# Patient Record
Sex: Female | Born: 1980 | Race: White | Hispanic: No | Marital: Single | Smoking: Former smoker
Health system: Southern US, Community
[De-identification: ages and names within clinical notes are randomized; demographics above are authoritative.]

## PROBLEM LIST (undated history)

## (undated) DIAGNOSIS — G43009 Migraine without aura, not intractable, without status migrainosus: Secondary | ICD-10-CM

## (undated) DIAGNOSIS — F1111 Opioid abuse, in remission: Secondary | ICD-10-CM

## (undated) HISTORY — DX: Migraine without aura, not intractable, without status migrainosus: G43.009

## (undated) HISTORY — DX: Opioid abuse, in remission: F11.11

## (undated) HISTORY — PX: WRIST SURGERY: SHX841

---

## 2007-11-07 ENCOUNTER — Emergency Department: Payer: Self-pay | Admitting: Unknown Physician Specialty

## 2008-09-23 ENCOUNTER — Emergency Department: Payer: Self-pay

## 2014-01-30 ENCOUNTER — Ambulatory Visit: Payer: Self-pay | Admitting: Emergency Medicine

## 2014-01-30 LAB — RAPID INFLUENZA A&B ANTIGENS (ARMC ONLY)

## 2014-06-24 DIAGNOSIS — J309 Allergic rhinitis, unspecified: Secondary | ICD-10-CM | POA: Insufficient documentation

## 2014-11-29 DIAGNOSIS — F1121 Opioid dependence, in remission: Secondary | ICD-10-CM | POA: Insufficient documentation

## 2014-12-18 ENCOUNTER — Emergency Department: Payer: Self-pay | Admitting: Emergency Medicine

## 2015-09-26 DIAGNOSIS — K5900 Constipation, unspecified: Secondary | ICD-10-CM | POA: Insufficient documentation

## 2015-12-08 ENCOUNTER — Other Ambulatory Visit: Payer: Self-pay | Admitting: Specialist

## 2015-12-08 DIAGNOSIS — M5442 Lumbago with sciatica, left side: Secondary | ICD-10-CM

## 2015-12-27 ENCOUNTER — Ambulatory Visit
Admission: RE | Admit: 2015-12-27 | Discharge: 2015-12-27 | Disposition: A | Discharge status: Home / Self Care | Payer: Medicaid Other | Source: Ambulatory Visit | Attending: Specialist | Admitting: Specialist

## 2015-12-27 DIAGNOSIS — M5442 Lumbago with sciatica, left side: Secondary | ICD-10-CM | POA: Insufficient documentation

## 2015-12-27 DIAGNOSIS — M5116 Intervertebral disc disorders with radiculopathy, lumbar region: Secondary | ICD-10-CM | POA: Insufficient documentation

## 2016-08-07 DIAGNOSIS — G4489 Other headache syndrome: Secondary | ICD-10-CM | POA: Insufficient documentation

## 2016-08-30 DIAGNOSIS — M545 Low back pain, unspecified: Secondary | ICD-10-CM | POA: Insufficient documentation

## 2017-03-20 DIAGNOSIS — F5111 Primary hypersomnia: Secondary | ICD-10-CM | POA: Insufficient documentation

## 2017-03-20 DIAGNOSIS — I8393 Asymptomatic varicose veins of bilateral lower extremities: Secondary | ICD-10-CM | POA: Insufficient documentation

## 2017-04-15 ENCOUNTER — Ambulatory Visit: Payer: Medicaid Other

## 2017-05-21 ENCOUNTER — Ambulatory Visit: Payer: Medicaid Other | Attending: Internal Medicine

## 2017-05-22 ENCOUNTER — Ambulatory Visit: Payer: Medicaid Other | Attending: Internal Medicine

## 2018-06-02 DIAGNOSIS — E785 Hyperlipidemia, unspecified: Secondary | ICD-10-CM | POA: Insufficient documentation

## 2018-08-12 ENCOUNTER — Encounter: Payer: Self-pay | Admitting: *Deleted

## 2018-08-12 ENCOUNTER — Telehealth: Payer: Self-pay | Admitting: Neurology

## 2018-08-12 ENCOUNTER — Encounter: Payer: Self-pay | Admitting: Neurology

## 2018-08-12 ENCOUNTER — Ambulatory Visit: Payer: Medicaid Other | Admitting: Neurology

## 2018-08-12 VITALS — BP 136/90 | HR 72 | Ht 71.0 in | Wt 200.0 lb

## 2018-08-12 DIAGNOSIS — R51 Headache with orthostatic component, not elsewhere classified: Secondary | ICD-10-CM

## 2018-08-12 DIAGNOSIS — G43009 Migraine without aura, not intractable, without status migrainosus: Secondary | ICD-10-CM | POA: Diagnosis not present

## 2018-08-12 DIAGNOSIS — G8929 Other chronic pain: Secondary | ICD-10-CM

## 2018-08-12 DIAGNOSIS — G4440 Drug-induced headache, not elsewhere classified, not intractable: Secondary | ICD-10-CM

## 2018-08-12 DIAGNOSIS — H539 Unspecified visual disturbance: Secondary | ICD-10-CM | POA: Diagnosis not present

## 2018-08-12 DIAGNOSIS — R519 Headache, unspecified: Secondary | ICD-10-CM

## 2018-08-12 MED ORDER — METHYLPREDNISOLONE 4 MG PO TBPK: ORAL_TABLET | ORAL | 2 refills | Status: DC

## 2018-08-12 MED ORDER — AMITRIPTYLINE HCL 25 MG PO TABS: 25.0000 mg | ORAL_TABLET | Freq: Every day | ORAL | 3 refills | Status: DC

## 2018-08-12 NOTE — Patient Instructions (Addendum)
MRI brain w/wo contrast Decrease or stop the Excedrin overuse Start Amitriptyline at bedtime to help with migraines and sleep Medrol dosepak (Steroids)    Amitriptyline tablets What is this medicine? AMITRIPTYLINE (a mee TRIP ti leen) is used to treat depression. This medicine may be used for other purposes; ask your health care provider or pharmacist if you have questions. COMMON BRAND NAME(S): Elavil, Vanatrip What should I tell my health care provider before I take this medicine? They need to know if you have any of these conditions: -an alcohol problem -asthma, difficulty breathing -bipolar disorder or schizophrenia -difficulty passing urine, prostate trouble -glaucoma -heart disease or previous heart attack -liver disease -over active thyroid -seizures -thoughts or plans of suicide, a previous suicide attempt, or family history of suicide attempt -an unusual or allergic reaction to amitriptyline, other medicines, foods, dyes, or preservatives -pregnant or trying to get pregnant -breast-feeding How should I use this medicine? Take this medicine by mouth with a drink of water. Follow the directions on the prescription label. You can take the tablets with or without food. Take your medicine at regular intervals. Do not take it more often than directed. Do not stop taking this medicine suddenly except upon the advice of your doctor. Stopping this medicine too quickly may cause serious side effects or your condition may worsen. A special MedGuide will be given to you by the pharmacist with each prescription and refill. Be sure to read this information carefully each time. Talk to your pediatrician regarding the use of this medicine in children. Special care may be needed. Overdosage: If you think you have taken too much of this medicine contact a poison control center or emergency room at once. NOTE: This medicine is only for you. Do not share this medicine with others. What if I  miss a dose? If you miss a dose, take it as soon as you can. If it is almost time for your next dose, take only that dose. Do not take double or extra doses. What may interact with this medicine? Do not take this medicine with any of the following medications: -arsenic trioxide -certain medicines used to regulate abnormal heartbeat or to treat other heart conditions -cisapride -droperidol -halofantrine -linezolid -MAOIs like Carbex, Eldepryl, Marplan, Nardil, and Parnate -methylene blue -other medicines for mental depression -phenothiazines like perphenazine, thioridazine and chlorpromazine -pimozide -probucol -procarbazine -sparfloxacin -St. John's Wort -ziprasidone This medicine may also interact with the following medications: -atropine and related drugs like hyoscyamine, scopolamine, tolterodine and others -barbiturate medicines for inducing sleep or treating seizures, like phenobarbital -cimetidine -disulfiram -ethchlorvynol -thyroid hormones such as levothyroxine This list may not describe all possible interactions. Give your health care provider a list of all the medicines, herbs, non-prescription drugs, or dietary supplements you use. Also tell them if you smoke, drink alcohol, or use illegal drugs. Some items may interact with your medicine. What should I watch for while using this medicine? Tell your doctor if your symptoms do not get better or if they get worse. Visit your doctor or health care professional for regular checks on your progress. Because it may take several weeks to see the full effects of this medicine, it is important to continue your treatment as prescribed by your doctor. Patients and their families should watch out for new or worsening thoughts of suicide or depression. Also watch out for sudden changes in feelings such as feeling anxious, agitated, panicky, irritable, hostile, aggressive, impulsive, severely restless, overly excited and hyperactive, or not  being able to sleep. If this happens, especially at the beginning of treatment or after a change in dose, call your health care professional. [place] may get drowsy or dizzy. Do not drive, use machinery, or do anything that needs mental alertness until you know how this medicine affects you. Do not stand or sit up quickly, especially if you are an older patient. This reduces the risk of dizzy or fainting spells. Alcohol may interfere with the effect of this medicine. Avoid alcoholic drinks. Do not treat yourself for coughs, colds, or allergies without asking your doctor or health care professional for advice. Some ingredients can increase possible side effects. Your mouth may get dry. Chewing sugarless gum or sucking hard candy, and drinking plenty of water will help. Contact your doctor if the problem does not go away or is severe. This medicine may cause dry eyes and blurred vision. If you wear contact lenses you may feel some discomfort. Lubricating drops may help. See your eye doctor if the problem does not go away or is severe. This medicine can cause constipation. Try to have a bowel movement at least every 2 to 3 days. If you do not have a bowel movement for 3 days, call your doctor or health care professional. This medicine can make you more sensitive to the sun. Keep out of the sun. If you cannot avoid being in the sun, wear protective clothing and use sunscreen. Do not use sun lamps or tanning beds/booths. What side effects may I notice from receiving this medicine? Side effects that you should report to your doctor or health care professional as soon as possible: -allergic reactions like skin rash, itching or hives, swelling of the face, lips, or tongue -anxious -breathing problems -changes in vision -confusion -elevated mood, decreased need for sleep, racing thoughts, impulsive behavior -eye pain -fast, irregular heartbeat -feeling faint or lightheaded, falls -feeling agitated, angry, or  irritable -fever with increased sweating -hallucination, loss of contact with reality -seizures -stiff muscles -suicidal thoughts or other mood changes -tingling, pain, or numbness in the feet or hands -trouble passing urine or change in the amount of urine -trouble sleeping -unusually weak or tired -vomiting -yellowing of the eyes or skin Side effects that usually do not require medical attention (report to your doctor or health care professional if they continue or are bothersome): -change in sex drive or performance -change in appetite or weight -constipation -dizziness -dry mouth -nausea -tired -tremors -upset stomach This list may not describe all possible side effects. Call your doctor for medical advice about side effects. You may report side effects to FDA at 1-800-FDA-1088. Where should I keep my medicine? Keep out of the reach of children. Store at room temperature between 20 and 25 degrees C (68 and 77 degrees F). Throw away any unused medicine after the expiration date. NOTE: This sheet is a summary. It may not cover all possible information. If you have questions about this medicine, talk to your doctor, pharmacist, or health care provider.  2018 Elsevier/Gold Standard (2016-03-22 12:14:15)  Methylprednisolone tablets What is this medicine? METHYLPREDNISOLONE (meth ill pred NISS oh lone) is a corticosteroid. It is commonly used to treat inflammation of the skin, joints, lungs, and other organs. Common conditions treated include asthma, allergies, and arthritis. It is also used for other conditions, such as blood disorders and diseases of the adrenal glands. This medicine may be used for other purposes; ask your health care provider or pharmacist if you have questions. COMMON BRAND NAME(S):  Medrol, Medrol Dosepak What should I tell my health care provider before I take this medicine? They need to know if you have any of these conditions: -Cushing's syndrome -eye  disease, vision problems -diabetes -glaucoma -heart disease -high blood pressure -infection (especially a virus infection such as chickenpox, cold sores, or herpes) -liver disease -mental illness -myasthenia gravis -osteoporosis -recently received or scheduled to receive a vaccine -seizures -stomach or intestine problems -thyroid disease -an unusual or allergic reaction to lactose, methylprednisolone, other medicines, foods, dyes, or preservatives -pregnant or trying to get pregnant -breast-feeding How should I use this medicine? Take this medicine by mouth with a glass of water. Follow the directions on the prescription label. Take this medicine with food. If you are taking this medicine once a day, take it in the morning. Do not take it more often than directed. Do not suddenly stop taking your medicine because you may develop a severe reaction. Your doctor will tell you how much medicine to take. If your doctor wants you to stop the medicine, the dose may be slowly lowered over time to avoid any side effects. Talk to your pediatrician regarding the use of this medicine in children. Special care may be needed. Overdosage: If you think you have taken too much of this medicine contact a poison control center or emergency room at once. NOTE: This medicine is only for you. Do not share this medicine with others. What if I miss a dose? If you miss a dose, take it as soon as you can. If it is almost time for your next dose, talk to your doctor or health care professional. You may need to miss a dose or take an extra dose. Do not take double or extra doses without advice. What may interact with this medicine? Do not take this medicine with any of the following medications: -alefacept -echinacea -live virus vaccines -metyrapone -mifepristone This medicine may also interact with the following medications: -amphotericin B -aspirin and aspirin-like medicines -certain antibiotics like  erythromycin, clarithromycin, troleandomycin -certain medicines for diabetes -certain medicines for fungal infections like ketoconazole -certain medicines for seizures like carbamazepine, phenobarbital, phenytoin -certain medicines that treat or prevent blood clots like warfarin -cholestyramine -cyclosporine -digoxin -diuretics -female hormones, like estrogens and birth control pills -isoniazid -NSAIDs, medicines for pain inflammation, like ibuprofen or naproxen -other medicines for myasthenia gravis -rifampin -vaccines This list may not describe all possible interactions. Give your health care provider a list of all the medicines, herbs, non-prescription drugs, or dietary supplements you use. Also tell them if you smoke, drink alcohol, or use illegal drugs. Some items may interact with your medicine. What should I watch for while using this medicine? Tell your doctor or healthcare professional if your symptoms do not start to get better or if they get worse. Do not stop taking except on your doctor's advice. You may develop a severe reaction. Your doctor will tell you how much medicine to take. This medicine may increase your risk of getting an infection. Tell your doctor or health care professional if you are around anyone with measles or chickenpox, or if you develop sores or blisters that do not heal properly. This medicine may affect blood sugar levels. If you have diabetes, check with your doctor or health care professional before you change your diet or the dose of your diabetic medicine. Tell your doctor or health care professional right away if you have any change in your eyesight. Using this medicine for a long time may increase  your risk of low bone mass. Talk to your doctor about bone health. What side effects may I notice from receiving this medicine? Side effects that you should report to your doctor or health care professional as soon as possible: -allergic reactions like skin  rash, itching or hives, swelling of the face, lips, or tongue -bloody or tarry stools -changes in vision -hallucination, loss of contact with reality -muscle cramps -muscle pain -palpitations -signs and symptoms of high blood sugar such as dizziness; dry mouth; dry skin; fruity breath; nausea; stomach pain; increased hunger or thirst; increased urination -signs and symptoms of infection like fever or chills; cough; sore throat; pain or trouble passing urine -trouble passing urine or change in the amount of urine Side effects that usually do not require medical attention (report to your doctor or health care professional if they continue or are bothersome): -changes in emotions or mood -constipation -diarrhea -excessive hair growth on the face or body -headache -nausea, vomiting -trouble sleeping -weight gain This list may not describe all possible side effects. Call your doctor for medical advice about side effects. You may report side effects to FDA at 1-800-FDA-1088. Where should I keep my medicine? Keep out of the reach of children. Store at room temperature between 20 and 25 degrees C (68 and 77 degrees F). Throw away any unused medicine after the expiration date. NOTE: This sheet is a summary. It may not cover all possible information. If you have questions about this medicine, talk to your doctor, pharmacist, or health care provider.  2018 Elsevier/Gold Standard (2015-12-28 15:53:30)

## 2018-08-12 NOTE — Telephone Encounter (Signed)
Patient is scheduled at [facility] in [facility] for 09/01/18 arrival time is 9:45 AM. I left voicemail for patient to be aware of this. I did leave their number of [phone] incase she needed to r/s.   Medicaid Josem Kaufmann: [id number] (exp. 08/12/18 to 09/11/18)

## 2018-08-12 NOTE — Progress Notes (Signed)
[facility]    Provider:  Dr Jaynee Eagles Referring Provider: Danelle Berry, NP Primary Care Physician:  Danelle Berry, NP  CC:  Migraines  HPI:  Faith Hill is a 37 y.o. female here as requested by Dr. Charlynn Grimes for migraines. PMHx long-term methadone use, HLD, elevated LFTs, current smoker, prior opioid abuse, 3-6 sodas a day, daily smoker, no alcohol, anxiety, depression. On Reglan and zofran. Headaches since a teenager. Started worsening in the last 18 months with migraines. She has had 2 sleep studies and were negative. Her migraines start on the right behind the eye and temple, She takes excedrin migraine which helps and sleep helps. They can be "debilitating" and moves sides, her neck hurts, pounding and throbbing with heart beat, +photo/phonophobia, nausea and vomiting. Lasts 24-72 hours. They put her in bed. Daily headaches that she wakes up with, they have woken her up in the middle of the night, worse when she bends over, she has 4 migraine days a month. She takes excedrin every other day. She has never been on a preventative for migraines. She has vision changes with the headaches especially when severe/ No other focal neurologic deficits, associated symptoms, inciting events or modifiable factors. No aura.   Reviewed notes, labs and imaging from outside physicians, which showed:  Notes reviewed from Dr. Charlynn Grimes state TSH, cbc,cmp recently taken will get the results. She has tried imitrex and magnesium. Imitrex did not work. Magnesium over 5-6 weeks did not make any difference. Sleep studies without OSA. She takes ondansetron with a headache. She was prescribed metoclopramide but did not get that.   Review of Systems: Patient complains of symptoms per HPI as well as the following symptoms: insomnia, memory loss, headache, weakness, racing thought. Pertinent negatives and positives per HPI. All others negative.   Social History   Socioeconomic History    . Marital status: Single    Spouse name: Not on file  . Number of children: 1  . Years of education: Not on file  . Highest education level: Some college, no degree  Occupational History  . Not on file  Social Needs  . Financial resource strain: Not on file  . Food insecurity:    Worry: Not on file    Inability: Not on file  . Transportation needs:    Medical: Not on file    Non-medical: Not on file  Tobacco Use  . Smoking status: Former Research scientist (life sciences)  . Smokeless tobacco: Never Used  . Tobacco comment: 1.25-1.5 ppd  Substance and Sexual Activity  . Alcohol use: Not Currently    Frequency: Never  . Drug use: Not Currently    Comment: quit 2009; h/o opioid abuse  . Sexual activity: Not Currently    Birth control/protection: None  Lifestyle  . Physical activity:    Days per week: Not on file    Minutes per session: Not on file  . Stress: Not on file  Relationships  . Social connections:    Talks on phone: Not on file    Gets together: Not on file    Attends religious service: Not on file    Active member of club or organization: Not on file    Attends meetings of clubs or organizations: Not on file    Relationship status: Not on file  . Intimate partner violence:    Fear of current or ex partner: Not on file    Emotionally abused: Not on file    Physically abused: Not  on file    Forced sexual activity: Not on file  Other Topics Concern  . Not on file  Social History Narrative   Caffeine: about 40 oz daily   Lives at home with her daughter and mother lives with them   Right handed    Family History  Problem Relation Age of Onset  . Heart disease Other   . Heart disease Mother     Past Medical History:  Diagnosis Date  . H/O opioid abuse ([place])    sober since 2009  . Migraines     Past Surgical History:  Procedure Laterality Date  . WRIST SURGERY Bilateral    pins     Current Outpatient Medications  Medication Sig Dispense Refill  .  aspirin-acetaminophen-caffeine (EXCEDRIN MIGRAINE) 250-250-65 MG tablet Take by mouth as needed for headache.    . furosemide (LASIX) 20 MG tablet Take 20 mg by mouth daily.    Marland Kitchen METHADONE HCL PO Take 70 mg by mouth daily. Liquid    . spironolactone-hydrochlorothiazide (ALDACTAZIDE) 25-25 MG tablet Take 1 tablet by mouth daily.  1  . amitriptyline (ELAVIL) 25 MG tablet Take 1 tablet (25 mg total) by mouth at bedtime. 30 tablet 3  . methylPREDNISolone (MEDROL DOSEPAK) 4 MG TBPK tablet Take pills each morning wit food x 6 days 21 tablet 2   No current facility-administered medications for this visit.     Allergies as of 08/12/2018 - Review Complete 08/12/2018  Allergen Reaction Noted  . Sulfa antibiotics Hives and Rash 08/12/2018    Vitals: BP 136/90 (BP Location: Left Arm, Patient Position: Sitting)   Pulse 72   Ht 5\' 11"  (1.803 m)   Wt 200 lb (90.7 kg)   BMI 27.89 kg/m  Last Weight:  Wt Readings from Last 1 Encounters:  08/12/18 200 lb (90.7 kg)   Last Height:   Ht Readings from Last 1 Encounters:  08/12/18 5\' 11"  (1.803 m)   Physical exam: Exam: Gen: NAD, conversant, well nourised, well groomed                     CV: RRR, no MRG. No Carotid Bruits. + mild distal peripheral edema, warm, nontender Eyes: Conjunctivae clear without exudates or hemorrhage  Neuro: Detailed Neurologic Exam  Speech:    Speech is normal; fluent and spontaneous with normal comprehension.  Cognition:    The patient is oriented to person, place, and time;     recent and remote memory intact;     language fluent;     normal attention, concentration,     fund of knowledge Cranial Nerves:    The pupils are equal, round, and reactive to light. The fundi are normal and spontaneous venous pulsations are present. Visual fields are full to finger confrontation. Extraocular movements are intact. Trigeminal sensation is intact and the muscles of mastication are normal. The face is symmetric. The palate  elevates in the midline. Hearing intact. Voice is normal. Shoulder shrug is normal. The tongue has normal motion without fasciculations.   Coordination:    Normal finger to nose and heel to shin. Normal rapid alternating movements.   Gait:    Heel-toe and tandem gait are normal.   Motor Observation:    No asymmetry, no atrophy, and no involuntary movements noted. Tone:    Normal muscle tone.    Posture:    Posture is normal. normal erect    Strength:    Strength is V/V in the upper and  lower limbs.      Sensation: intact to LT     Reflex Exam:  DTR's:    Deep tendon reflexes in the upper and lower extremities are normal bilaterally.   Toes:    The toes are downgoing bilaterally.   Clonus:    Clonus is absent.       Assessment/Plan:  37 year old with migraines  - 2 sleep studies normal - MRI brain due to concerning symptoms of morning headaches, positional headaches,vision changes  to look for space occupying mass, chiari or intracranial hypertension (pseudotumor). - Takes excedrin every other day, discussed medication overuse - Will start with Amitriptyline since she has severe insomnia which it can help with as well.  - Stop Excedrin and other OTC meds and excessive caffeine (discussed in detail) - She sees an ENT for sinus problems - A medrol dosepak to bridge - following with cardiology for swelling, no heart arrythmias please discuss amitriptyline with cardiologist (sees them tomorrow)  Meds ordered this encounter  Medications  . amitriptyline (ELAVIL) 25 MG tablet    Sig: Take 1 tablet (25 mg total) by mouth at bedtime.    Dispense:  30 tablet    Refill:  3  . methylPREDNISolone (MEDROL DOSEPAK) 4 MG TBPK tablet    Sig: Take pills each morning wit food x 6 days    Dispense:  21 tablet    Refill:  2   Orders Placed This Encounter  Procedures  . MR BRAIN W WO CONTRAST     Discussed: To prevent or relieve headaches, try the following: Cool Compress.  Lie down and place a cool compress on your head.  Avoid headache triggers. If certain foods or odors seem to have triggered your migraines in the past, avoid them. A headache diary might help you identify triggers.  Include physical activity in your daily routine. Try a daily walk or other moderate aerobic exercise.  Manage stress. Find healthy ways to cope with the stressors, such as delegating tasks on your to-do list.  Practice relaxation techniques. Try deep breathing, yoga, massage and visualization.  Eat regularly. Eating regularly scheduled meals and maintaining a healthy diet might help prevent headaches. Also, drink plenty of fluids.  Follow a regular sleep schedule. Sleep deprivation might contribute to headaches Consider biofeedback. With this mind-body technique, you learn to control certain bodily functions - such as muscle tension, heart rate and blood pressure - to prevent headaches or reduce headache pain.    Proceed to emergency room if you experience new or worsening symptoms or symptoms do not resolve, if you have new neurologic symptoms or if headache is severe, or for any concerning symptom.   Provided education and documentation from American headache Society toolbox including articles on: chronic migraine medication overuse headache, chronic migraines, prevention of migraines, behavioral and other nonpharmacologic treatments for headache.  A total of 65 minutes was spent face-to-face with this patient. Over half this time was spent on counseling patient on the  1. Migraine without aura and without status migrainosus, not intractable   2. Medication overuse headache   3. Chronic intractable headache, unspecified headache type   4. Chronic daily headache   5. Vision changes   6. Morning headache   7. Positional headache   8. Nocturnal headaches     diagnosis and different diagnostic and therapeutic options, counseling and coordination of care, risks ans benefits of  management, compliance, or risk factor reduction and education.    Cc: Charlynn Grimes,  Ardis Rowan, NP   Sarina Ill, MD  [facility] [address] [place], [place] [address]  Phone [phone] Fax [phone]

## 2018-09-01 ENCOUNTER — Ambulatory Visit: Admission: RE | Admit: 2018-09-01 | Payer: Medicaid Other | Source: Ambulatory Visit

## 2018-09-14 NOTE — Telephone Encounter (Signed)
New Medicaid auth: [id number] (Exp. 09/02/18 to 10/02/18) patient is schedule at [facility] on 09/18/18.

## 2018-09-18 ENCOUNTER — Encounter (INDEPENDENT_AMBULATORY_CARE_PROVIDER_SITE_OTHER): Payer: Self-pay

## 2018-09-18 ENCOUNTER — Ambulatory Visit
Admission: RE | Admit: 2018-09-18 | Discharge: 2018-09-18 | Disposition: A | Discharge status: Home / Self Care | Payer: Medicaid Other | Source: Ambulatory Visit | Attending: Neurology | Admitting: Neurology

## 2018-09-18 DIAGNOSIS — H539 Unspecified visual disturbance: Secondary | ICD-10-CM | POA: Diagnosis present

## 2018-09-18 DIAGNOSIS — R51 Headache with orthostatic component, not elsewhere classified: Secondary | ICD-10-CM

## 2018-09-18 DIAGNOSIS — R519 Headache, unspecified: Secondary | ICD-10-CM

## 2018-09-18 MED ORDER — GADOBUTROL 1 MMOL/ML IV SOLN
9.5000 mL | Freq: Once | INTRAVENOUS | Status: AC | PRN
  Administered 2018-09-18: 9.5 mL via INTRAVENOUS

## 2018-09-21 ENCOUNTER — Telehealth: Payer: Self-pay | Admitting: *Deleted

## 2018-09-21 NOTE — Telephone Encounter (Signed)
Spoke with pt and informed her that her MRI brain is normal. She verbalized understanding and appreciation. She requested that her MRI results are sent to PCP.   Results faxed to Evern Bio NP.

## 2018-09-21 NOTE — Telephone Encounter (Signed)
-----   Message from Melvenia Beam, MD sent at 09/18/2018  1:12 PM EST ----- MRI of the brain is normal thanks

## 2018-10-13 ENCOUNTER — Encounter: Payer: Self-pay | Admitting: Podiatry

## 2018-10-13 ENCOUNTER — Ambulatory Visit (INDEPENDENT_AMBULATORY_CARE_PROVIDER_SITE_OTHER): Payer: Medicaid Other

## 2018-10-13 ENCOUNTER — Ambulatory Visit: Payer: Medicaid Other | Admitting: Podiatry

## 2018-10-13 ENCOUNTER — Other Ambulatory Visit: Payer: Self-pay | Admitting: Podiatry

## 2018-10-13 VITALS — BP 125/81 | HR 85

## 2018-10-13 DIAGNOSIS — M722 Plantar fascial fibromatosis: Secondary | ICD-10-CM

## 2018-10-13 DIAGNOSIS — M79671 Pain in right foot: Secondary | ICD-10-CM

## 2018-10-13 DIAGNOSIS — M79672 Pain in left foot: Principal | ICD-10-CM

## 2018-10-13 MED ORDER — MELOXICAM 15 MG PO TABS: 15.0000 mg | ORAL_TABLET | Freq: Every day | ORAL | 1 refills | Status: AC

## 2018-10-13 MED ORDER — METHYLPREDNISOLONE 4 MG PO TBPK: ORAL_TABLET | ORAL | 0 refills | Status: DC

## 2018-10-14 NOTE — Progress Notes (Signed)
   Subjective: 37 year old female presenting today as a new patient with a chief complaint of left plantar heel pain that began 2-3 months ago and pain to the ball of the right foot that began two weeks ago. She was seen by her PCP and X-Rays revealed heel spurs in the left foot and arthritis in the right. She has not had any treatment for her symptoms. Walking and bearing weight increases the pain. Patient is here for further evaluation and treatment.   Past Medical History:  Diagnosis Date  . H/O opioid abuse ([place])    sober since 2009  . Migraines      Objective: Physical Exam General: The patient is alert and oriented x3 in no acute distress.  Dermatology: Skin is warm, dry and supple bilateral lower extremities. Negative for open lesions or macerations bilateral.   Vascular: Dorsalis Pedis and Posterior Tibial pulses palpable bilateral.  Capillary fill time is immediate to all digits.  Neurological: Epicritic and protective threshold intact bilateral.   Musculoskeletal: Tenderness to palpation to the plantar aspect of the left heel along the plantar fascia. All other joints range of motion within normal limits bilateral. Strength 5/5 in all groups bilateral.   Radiographic exam:   Normal osseous mineralization. Joint spaces preserved. No fracture/dislocation/boney destruction. No other soft tissue abnormalities or radiopaque foreign bodies.   Assessment: 1. Plantar fasciitis left foot  Plan of Care:  1. Patient evaluated. Xrays reviewed.   2. Injection of 0.5cc Celestone soluspan injected into the left plantar fascia.  3. Rx for Medrol Dose Pak placed 4. Rx for Meloxicam ordered for patient. 5. Recommended OTC insoles.  6. Instructed patient regarding therapies and modalities at home to alleviate symptoms.  7. Return to clinic in 4 weeks.    Works at [employer].   Edrick Kins, DPM [facility]  Dr. Edrick Kins, DPM    [address], [place] [address]                Office [phone]  Fax [phone]

## 2018-11-10 ENCOUNTER — Ambulatory Visit (INDEPENDENT_AMBULATORY_CARE_PROVIDER_SITE_OTHER): Payer: BLUE CROSS/BLUE SHIELD | Admitting: Podiatry

## 2018-11-10 ENCOUNTER — Encounter: Payer: Self-pay | Admitting: Podiatry

## 2018-11-10 DIAGNOSIS — M722 Plantar fascial fibromatosis: Secondary | ICD-10-CM

## 2018-11-10 DIAGNOSIS — B351 Tinea unguium: Secondary | ICD-10-CM

## 2018-11-10 MED ORDER — TERBINAFINE HCL 250 MG PO TABS: 250.0000 mg | ORAL_TABLET | Freq: Every day | ORAL | 0 refills | Status: DC

## 2018-11-12 NOTE — Progress Notes (Signed)
   Subjective: 38 year old female presenting today for follow up evaluation of plantar fasciitis of the left foot. She states her pain has improved significantly. She has been using the OTC insoles and taking Meloxicam for treatment. There are no modifying factors noted.  She reports a new complaint of numbness to toes 1-4 of the right foot that began a few weeks ago. She reports associated dry skin on her toes and possible fungus of the toenails and states some of them are lifting from the nailbed. She has not done anything for treatment and denies modifying factors. She denies pain. Patient is here for further evaluation and treatment.   Past Medical History:  Diagnosis Date  . H/O opioid abuse ([place])    sober since 2009  . Migraines     Objective: Physical Exam General: The patient is alert and oriented x3 in no acute distress.  Dermatology: Hyperkeratotic, discolored, thickened, onychodystrophy of nails noted bilaterally. Skin is warm, dry and supple bilateral lower extremities. Negative for open lesions or macerations.  Vascular: Palpable pedal pulses bilaterally. No edema or erythema noted. Capillary refill within normal limits.  Neurological: Epicritic and protective threshold grossly intact bilaterally.   Musculoskeletal Exam: Range of motion within normal limits to all pedal and ankle joints bilateral. Muscle strength 5/5 in all groups bilateral.   Assessment: #1 onychomycosis nails 1-5 bilateral #2 plantar fasciitis left - resolved   Plan of Care:  #1 Patient was evaluated. #2 Continue using OTC insoles and taking Meloxicam as needed.  #3 Recommended good shoe gear.  #4 Prescription for Lamisil 250 mg #90 provided to patient.  #5 Return to clinic as needed.   Works at [employer].    Edrick Kins, DPM [facility]  Dr. Edrick Kins, [place]                                        [place], [place] [address]                Office [phone]  Fax [phone]

## 2018-12-14 ENCOUNTER — Ambulatory Visit: Payer: Medicaid Other | Admitting: Neurology

## 2018-12-14 ENCOUNTER — Encounter: Payer: Self-pay | Admitting: Neurology

## 2018-12-14 VITALS — BP 146/93 | HR 81 | Ht 71.0 in | Wt 199.0 lb

## 2018-12-14 DIAGNOSIS — R51 Headache: Secondary | ICD-10-CM

## 2018-12-14 DIAGNOSIS — H539 Unspecified visual disturbance: Secondary | ICD-10-CM

## 2018-12-14 DIAGNOSIS — G43711 Chronic migraine without aura, intractable, with status migrainosus: Secondary | ICD-10-CM | POA: Insufficient documentation

## 2018-12-14 DIAGNOSIS — G43009 Migraine without aura, not intractable, without status migrainosus: Secondary | ICD-10-CM | POA: Diagnosis not present

## 2018-12-14 DIAGNOSIS — G4440 Drug-induced headache, not elsewhere classified, not intractable: Secondary | ICD-10-CM

## 2018-12-14 DIAGNOSIS — R519 Headache, unspecified: Secondary | ICD-10-CM

## 2018-12-14 MED ORDER — TOPIRAMATE 100 MG PO TABS: ORAL_TABLET | ORAL | 11 refills | Status: DC

## 2018-12-14 MED ORDER — AMITRIPTYLINE HCL 25 MG PO TABS: 50.0000 mg | ORAL_TABLET | Freq: Every day | ORAL | 6 refills | Status: DC

## 2018-12-14 MED ORDER — GALCANEZUMAB-GNLM 120 MG/ML ~~LOC~~ SOAJ: 120.0000 mg | SUBCUTANEOUS | 0 refills | Status: DC

## 2018-12-14 NOTE — Patient Instructions (Signed)
Start Topiramate every night Emaglity monthly  Galcanezumab: Patient drug information [employer] here. Copyright 9795842582 [employer] All rights reserved. (For additional information see "Galcanezumab: Drug information") Brand Names: [place]  Emgality;  Emgality (300 MG Dose)  Brand Names: [place]  Emgality  What is this drug used for?   It is used to prevent migraine headaches.   It is used to treat cluster headaches.  What do I need to tell my doctor BEFORE I take this drug?   If you are allergic to this drug; any part of this drug; or any other drugs, foods, or substances. Tell your doctor about the allergy and what signs you had.   This drug may interact with other drugs or health problems.   Tell your doctor and pharmacist about all of your drugs (prescription or OTC, natural products, vitamins) and health problems. You must check to make sure that it is safe for you to take this drug with all of your drugs and health problems. Do not start, stop, or change the dose of any drug without checking with your doctor.  What are some things I need to know or do while I take this drug?   Tell all of your health care providers that you take this drug. This includes your doctors, nurses, pharmacists, and dentists.   Do not share pen or cartridge devices with another person even if the needle has been changed. Sharing these devices may pass infections from one person to another. This includes infections you may not know you have.   Tell your doctor if you are pregnant, plan on getting pregnant, or are breast-feeding. You will need to talk about the benefits and risks to you and the baby.  What are some side effects that I need to call my doctor about right away?   WARNING/CAUTION: Even though it may be rare, some people may have very bad and sometimes deadly side effects when taking a drug. Tell your doctor or get medical help right away if you have any of the following signs or  symptoms that may be related to a very bad side effect:   Signs of an allergic reaction, like rash; hives; itching; red, swollen, blistered, or peeling skin with or without fever; wheezing; tightness in the chest or throat; trouble breathing, swallowing, or talking; unusual hoarseness; or swelling of the mouth, face, lips, tongue, or throat.  What are some other side effects of this drug?   All drugs may cause side effects. However, many people have no side effects or only have minor side effects. Call your doctor or get medical help if any of these side effects or any other side effects bother you or do not go away:   Irritation where the shot is given.   These are not all of the side effects that may occur. If you have questions about side effects, call your doctor. Call your doctor for medical advice about side effects.   You may report side effects to your national health agency.  How is this drug best taken?   Use this drug as ordered by your doctor. Read all information given to you. Follow all instructions closely.   It is given as a shot into the fatty part of the skin in the upper arm, thigh, buttocks, or stomach area.   If you will be giving yourself the shot, your doctor or nurse will teach you how to give the shot.   Wash your hands before use.  If stored in a refrigerator, let this drug come to room temperature before using it. Leave it at room temperature for at least 30 minutes. Do not heat this drug.   Do not shake.   Do not give into skin that is irritated, bruised, red, infected, or scarred.   Do not give into skin within 2 inches of the belly button.   If your dose is more than 1 injection, you may give it in the same body part. Make sure you do not give injections in the same spot you used for the other injections.   Do not rub the site where you give the shot.   Do not use if the solution is cloudy, leaking, or has particles.   This drug is colorless to slightly yellow or  slightly brown. Do not use if the solution changes color.   Move the site where you give the shot with each shot.   If you drop this drug on a hard surface, do not use it.   Each prefilled pen or syringe is for one use only.   Throw away needles in a needle/sharp disposal box. Do not reuse needles or other items. When the box is full, follow all local rules for getting rid of it. Talk with a doctor or pharmacist if you have any questions.  What do I do if I miss a dose?   Take a missed dose as soon as you think about it.   After taking a missed dose, start a new schedule based on when the dose is taken.  How do I store and/or throw out this drug?   Store in a refrigerator. Do not freeze.   Store in the original container to protect from light.   If needed, you may store at room temperature for up to 7 days. Write down the date you take this drug out of the refrigerator. If stored at room temperature and not used within 7 days, throw this drug away.   Do not put this drug back in the refrigerator after it has been stored at room temperature.   Keep all drugs in a safe place. Keep all drugs out of the reach of children and pets.   Throw away unused or expired drugs. Do not flush down a toilet or pour down a drain unless you are told to do so. Check with your pharmacist if you have questions about the best way to throw out drugs. There may be drug take-back programs in your area.  General drug facts   If your symptoms or health problems do not get better or if they become worse, call your doctor.   Do not share your drugs with others and do not take anyone else's drugs.   Some drugs may have another patient information leaflet. If you have any questions about this drug, please talk with your doctor, nurse, pharmacist, or other health care provider.   If you think there has been an overdose, call your poison control center or get medical care right away. Be ready to tell or show what was taken, how  much, and when it happened.

## 2018-12-14 NOTE — Addendum Note (Signed)
Addended by: Sarina Ill B on: 12/14/2018 10:17 AM   Modules accepted: Orders

## 2018-12-14 NOTE — Progress Notes (Addendum)
[facility]    Provider:  Dr Jaynee Eagles Referring Provider: Danelle Berry, NP Primary Care Physician:  Danelle Berry, NP  CC:  Migraines  Interval history; Tried Amitriptyline didn;t work. Still continues to overuse OTC medications. Had a medrol dosepak. The amitriptyline didn't help. Will try topiramate. Also CGRP has evidence that it can help with migraines and medictaion overuse will give samples for 3 months.   HPI:  Faith Hill is a 38 y.o. female here as requested by Dr. Charlynn Grimes for migraines. PMHx long-term methadone use, HLD, elevated LFTs, current smoker, prior opioid abuse, 3-6 sodas a day, daily smoker, no alcohol, anxiety, depression. On Reglan and zofran. Headaches since a teenager. Started worsening in the last 18 months with migraines. She has had 2 sleep studies and were negative. Her migraines start on the right behind the eye and temple, She takes excedrin migraine which helps and sleep helps. They can be "debilitating" and moves sides, her neck hurts, pounding and throbbing with heart beat, +photo/phonophobia, nausea and vomiting. Lasts 24-72 hours. They put her in bed. Daily headaches that she wakes up with, they have woken her up in the middle of the night, worse when she bends over, she has 4 migraine days a month. She takes excedrin every other day. She has never been on a preventative for migraines. She has vision changes with the headaches especially when severe/ No other focal neurologic deficits, associated symptoms, inciting events or modifiable factors. No aura.   Reviewed notes, labs and imaging from outside physicians, which showed:  Notes reviewed from Dr. Charlynn Grimes state TSH, cbc,cmp recently taken will get the results. She has tried imitrex and magnesium. Imitrex did not work. Magnesium over 5-6 weeks did not make any difference. Sleep studies without OSA. She takes ondansetron with a headache. She was prescribed metoclopramide but  did not get that.   Review of Systems: Patient complains of symptoms per HPI as well as the following symptoms: insomnia, memory loss, headache, weakness, racing thought. Pertinent negatives and positives per HPI. All others negative.   Social History   Socioeconomic History  . Marital status: Single    Spouse name: Not on file  . Number of children: 1  . Years of education: Not on file  . Highest education level: Some college, no degree  Occupational History  . Not on file  Social Needs  . Financial resource strain: Not on file  . Food insecurity:    Worry: Not on file    Inability: Not on file  . Transportation needs:    Medical: Not on file    Non-medical: Not on file  Tobacco Use  . Smoking status: Former Research scientist (life sciences)  . Smokeless tobacco: Never Used  . Tobacco comment: 1.25-1.5 ppd  Substance and Sexual Activity  . Alcohol use: Not Currently    Frequency: Never  . Drug use: Not Currently    Comment: quit 2009; h/o opioid abuse  . Sexual activity: Yes    Partners: Male    Birth control/protection: None  Lifestyle  . Physical activity:    Days per week: Not on file    Minutes per session: Not on file  . Stress: Not on file  Relationships  . Social connections:    Talks on phone: Not on file    Gets together: Not on file    Attends religious service: Not on file    Active member of club or organization: Not on file    Attends  meetings of clubs or organizations: Not on file    Relationship status: Not on file  . Intimate partner violence:    Fear of current or ex partner: Not on file    Emotionally abused: Not on file    Physically abused: Not on file    Forced sexual activity: Not on file  Other Topics Concern  . Not on file  Social History Narrative   Caffeine: about 40 oz daily   Lives at home with her daughter and mother lives with them   Right handed    Family History  Problem Relation Age of Onset  . Heart disease Other   . Heart disease Mother      Past Medical History:  Diagnosis Date  . H/O opioid abuse ([place])    sober since 2009  . Migraines     Past Surgical History:  Procedure Laterality Date  . WRIST SURGERY Bilateral    pins     Current Outpatient Medications  Medication Sig Dispense Refill  . aspirin-acetaminophen-caffeine (EXCEDRIN MIGRAINE) 250-250-65 MG tablet Take by mouth as needed for headache.    . furosemide (LASIX) 20 MG tablet Take 40 mg by mouth daily.     Marland Kitchen glycopyrrolate (ROBINUL) 1 MG tablet Glycopyrrolate 1 MG Oral Tablet QTY: 30 tablet Days: 30 Refills: 1  Written: 10/23/18 Patient Instructions: Take 1 tablet by mouth daily in AM    . METHADONE HCL PO Take 60 mg by mouth daily. Liquid     . terbinafine (LAMISIL) 250 MG tablet Take 1 tablet (250 mg total) by mouth daily. 90 tablet 0  . amitriptyline (ELAVIL) 25 MG tablet Take 2 tablets (50 mg total) by mouth at bedtime. 60 tablet 6  . eletriptan (RELPAX) 20 MG tablet Relpax 20 MG Oral Tablet QTY: 12 tablet Days: 30 Refills: 1  Written: 06/02/18 Patient Instructions: Take 1 tablet orally at onset of migraine headache, may repeat 2nd tablet 4 hours later if headache persists    . Galcanezumab-gnlm (EMGALITY) 120 MG/ML SOAJ Inject 120 mg into the skin every 30 (thirty) days. 4 pen 0  . topiramate (TOPAMAX) 100 MG tablet Start with 1/2 tab at bedtime for 2 weeks then increase to a whole tablet. 30 tablet 11   No current facility-administered medications for this visit.     Allergies as of 12/14/2018 - Review Complete 12/14/2018  Allergen Reaction Noted  . Sulfa antibiotics Hives and Rash 08/12/2018    Vitals: BP (!) 146/93 (BP Location: Left Arm, Patient Position: Sitting)   Pulse 81   Ht 5\' 11"  (1.803 m)   Wt 199 lb (90.3 kg)   BMI 27.75 kg/m  Last Weight:  Wt Readings from Last 1 Encounters:  12/14/18 199 lb (90.3 kg)   Last Height:   Ht Readings from Last 1 Encounters:  12/14/18 5\' 11"  (1.803 m)   Physical exam: Exam: Gen: NAD,  conversant, well nourised, well groomed                     CV: RRR, no MRG. No Carotid Bruits. + mild distal peripheral edema, warm, nontender Eyes: Conjunctivae clear without exudates or hemorrhage  Neuro: Detailed Neurologic Exam  Speech:    Speech is normal; fluent and spontaneous with normal comprehension.  Cognition:    The patient is oriented to person, place, and time;     recent and remote memory intact;     language fluent;     normal attention, concentration,  fund of knowledge Cranial Nerves:    The pupils are equal, round, and reactive to light. The fundi are normal and spontaneous venous pulsations are present. Visual fields are full to finger confrontation. Extraocular movements are intact. Trigeminal sensation is intact and the muscles of mastication are normal. The face is symmetric. The palate elevates in the midline. Hearing intact. Voice is normal. Shoulder shrug is normal. The tongue has normal motion without fasciculations.   Coordination:    Normal finger to nose and heel to shin. Normal rapid alternating movements.   Gait:    Heel-toe and tandem gait are normal.   Motor Observation:    No asymmetry, no atrophy, and no involuntary movements noted. Tone:    Normal muscle tone.    Posture:    Posture is normal. normal erect    Strength:    Strength is V/V in the upper and lower limbs.      Sensation: intact to LT     Reflex Exam:  DTR's:    Deep tendon reflexes in the upper and lower extremities are normal bilaterally.   Toes:    The toes are downgoing bilaterally.   Clonus:    Clonus is absent.       Assessment/Plan:  38 year old with migraines and medication overuse  Tried and failed Amitriptyline Start Topiramate Provided 3 months samples of Emgality (there is evidence this can help with migraines and medictaion overuse)  - 2 sleep studies normal - MRI brain due to concerning symptoms of morning headaches, positional headaches,vision  changes  to look for space occupying mass, chiari or intracranial hypertension (pseudotumor). NORMAL. - Takes excedrin every other day, discussed medication overuse. She has reduced her intake - Ca continue Amitriptyline since she has severe insomnia which it can help with as well.  - Stop Excedrin and other OTC meds and excessive caffeine (discussed in detail) - She sees an ENT for sinus problems - A medrol dosepak to bridge last time, will not repeat - following with cardiology for swelling, no heart arrythmias please discuss amitriptyline with cardiologist (sees them tomorrow)  Meds ordered this encounter  Medications  . topiramate (TOPAMAX) 100 MG tablet    Sig: Start with 1/2 tab at bedtime for 2 weeks then increase to a whole tablet.    Dispense:  30 tablet    Refill:  11  . Galcanezumab-gnlm (EMGALITY) 120 MG/ML SOAJ    Sig: Inject 120 mg into the skin every 30 (thirty) days.    Dispense:  4 pen    Refill:  0    Order Specific Question:   Lot Number?    Answer:   [id number]    Order Specific Question:   Expiration Date?    Answer:   03/04/2020    Order Specific Question:   Quantity    Answer:   4    Comments:   pen  . amitriptyline (ELAVIL) 25 MG tablet    Sig: Take 2 tablets (50 mg total) by mouth at bedtime.    Dispense:  60 tablet    Refill:  6   Discussed teratogenicity of above, do not get pregnant  Discussed: To prevent or relieve headaches, try the following: Cool Compress. Lie down and place a cool compress on your head.  Avoid headache triggers. If certain foods or odors seem to have triggered your migraines in the past, avoid them. A headache diary might help you identify triggers.  Include physical activity in your daily routine.  Try a daily walk or other moderate aerobic exercise.  Manage stress. Find healthy ways to cope with the stressors, such as delegating tasks on your to-do list.  Practice relaxation techniques. Try deep breathing, yoga, massage and  visualization.  Eat regularly. Eating regularly scheduled meals and maintaining a healthy diet might help prevent headaches. Also, drink plenty of fluids.  Follow a regular sleep schedule. Sleep deprivation might contribute to headaches Consider biofeedback. With this mind-body technique, you learn to control certain bodily functions - such as muscle tension, heart rate and blood pressure - to prevent headaches or reduce headache pain.    Proceed to emergency room if you experience new or worsening symptoms or symptoms do not resolve, if you have new neurologic symptoms or if headache is severe, or for any concerning symptom.   Provided education and documentation from American headache Society toolbox including articles on: chronic migraine medication overuse headache, chronic migraines, prevention of migraines, behavioral and other nonpharmacologic treatments for headache.  A total of 25 minutes was spent face-to-face with this patient. Over half this time was spent on counseling patient on the  1. Chronic migraine without aura, with intractable migraine, so stated, with status migrainosus   2. Migraine without aura and without status migrainosus, not intractable   3. Medication overuse headache   4. Chronic daily headache   5. Vision changes     diagnosis and different diagnostic and therapeutic options, counseling and coordination of care, risks ans benefits of management, compliance, or risk factor reduction and education.    Cc: Danelle Berry, NP   Sarina Ill, MD  [facility] [address] [place], [place] [address]  Phone [phone] Fax [phone]

## 2018-12-29 ENCOUNTER — Encounter: Payer: Medicaid Other | Admitting: Obstetrics and Gynecology

## 2018-12-31 ENCOUNTER — Encounter: Payer: Medicaid Other | Admitting: Obstetrics and Gynecology

## 2019-01-07 ENCOUNTER — Encounter: Payer: Medicaid Other | Admitting: Obstetrics and Gynecology

## 2019-01-07 ENCOUNTER — Encounter: Payer: Self-pay | Admitting: Obstetrics and Gynecology

## 2019-01-08 ENCOUNTER — Encounter: Payer: Medicaid Other | Admitting: Obstetrics & Gynecology

## 2019-03-09 ENCOUNTER — Telehealth: Payer: Self-pay

## 2019-03-09 NOTE — Telephone Encounter (Signed)
I was able to speak with the patient and r/s her appt with Amy to 03/23/2019 at 11:30 am. She has given verbal consent to do a doxy.me visit and to file her insurance. E-mail has been confirmed and sent.   E-mail: [email or web address]

## 2019-03-15 ENCOUNTER — Ambulatory Visit: Payer: Medicaid Other | Admitting: Family Medicine

## 2019-03-23 ENCOUNTER — Other Ambulatory Visit: Payer: Self-pay

## 2019-03-23 ENCOUNTER — Ambulatory Visit (INDEPENDENT_AMBULATORY_CARE_PROVIDER_SITE_OTHER): Payer: Medicaid Other | Admitting: Family Medicine

## 2019-03-23 ENCOUNTER — Encounter: Payer: Self-pay | Admitting: Family Medicine

## 2019-03-23 DIAGNOSIS — G43009 Migraine without aura, not intractable, without status migrainosus: Secondary | ICD-10-CM | POA: Diagnosis not present

## 2019-03-23 MED ORDER — GALCANEZUMAB-GNLM 120 MG/ML ~~LOC~~ SOAJ: 120.0000 mg | SUBCUTANEOUS | 3 refills | Status: DC

## 2019-03-23 NOTE — Progress Notes (Signed)
Made any corrections needed, and agree with history, physical, neuro exam,assessment and plan as stated.     Antonia Ahern, MD [facility]  

## 2019-03-23 NOTE — Progress Notes (Signed)
PATIENT: Faith Hill DOB: [birth year]  REASON FOR VISIT: follow up HISTORY FROM: patient  Virtual Visit via Telephone Note  I connected with Faith Hill on 03/23/19 at 11:00 AM EDT by telephone and verified that I am speaking with the correct person using two identifiers.   I discussed the limitations, risks, security and privacy concerns of performing an evaluation and management service by telephone and the availability of in person appointments. I also discussed with the patient that there may be a patient responsible charge related to this service. The patient expressed understanding and agreed to proceed.   History of Present Illness:  03/23/19 Faith Hill is a 38 y.o. female for follow up.  She reports that she is doing fairly well.  She does continue Emgality injections monthly.  She has not noted significant difference in the frequency of her migraines.  She continues to have daily tension headaches.  She reports at least 2-3 times a week she has migrainous features of light and sound sensitivity as well as nausea.  She states that she has not continued amitriptyline consistently.  She also does not think that she has taken topiramate regularly.  She denies any adverse effects with these medications but states that she is not in her normal routine and has forgotten to take these.  History (copied from Dr Cathren Laine note on 12/14/2018)  HPI:  Faith Hill is a 38 y.o. female here as requested by Dr. Charlynn Grimes for migraines. PMHx long-term methadone use, HLD, elevated LFTs, current smoker, prior opioid abuse, 3-6 sodas a day, daily smoker, no alcohol, anxiety, depression. On Reglan and zofran. Headaches since a teenager. Started worsening in the last 18 months with migraines. She has had 2 sleep studies and were negative. Her migraines start on the right behind the eye and temple, She takes excedrin migraine which helps and sleep helps. They can be  "debilitating" and moves sides, her neck hurts, pounding and throbbing with heart beat, +photo/phonophobia, nausea and vomiting. Lasts 24-72 hours. They put her in bed. Daily headaches that she wakes up with, they have woken her up in the middle of the night, worse when she bends over, she has 4 migraine days a month. She takes excedrin every other day. She has never been on a preventative for migraines. She has vision changes with the headaches especially when severe/ No other focal neurologic deficits, associated symptoms, inciting events or modifiable factors. No aura.   Reviewed notes, labs and imaging from outside physicians, which showed:  Notes reviewed from Dr. Charlynn Grimes state TSH, cbc,cmp recently taken will get the results. She has tried imitrex and magnesium. Imitrex did not work. Magnesium over 5-6 weeks did not make any difference. Sleep studies without OSA. She takes ondansetron with a headache. She was prescribed metoclopramide but did not get that.   Observations/Objective:  Generalized: Well developed, in no acute distress  Mentation: Alert oriented to time, place, history taking. Follows all commands speech and language fluent   Assessment and Plan:  38 y.o. year old female  has a past medical history of H/O opioid abuse ([place]) and Migraine without aura. with    ICD-10-CM   1. Migraine without aura and without status migrainosus, not intractable G43.009 Galcanezumab-gnlm (EMGALITY) 120 MG/ML SOAJ   Ms. Tamura continues to have regular migraines.  Unfortunately, she has not taken medication regimen as suggested by Dr. Lavell Anchors.  I have advised that she restart amitriptyline and topiramate as prescribed.  I will  refill Emgality today.  She will continue all 3 medications for the next 3 months.  We will follow-up at that time to assess response to medications and consider change in therapy if warranted.  She verbalizes agreement and understanding with this plan.  No orders of the  defined types were placed in this encounter.   Meds ordered this encounter  Medications  . Galcanezumab-gnlm (EMGALITY) 120 MG/ML SOAJ    Sig: Inject 120 mg into the skin every 30 (thirty) days.    Dispense:  3 pen    Refill:  3    Order Specific Question:   Supervising Provider    Answer:   Melvenia Beam [id number]     Follow Up Instructions:  I discussed the assessment and treatment plan with the patient. The patient was provided an opportunity to ask questions and all were answered. The patient agreed with the plan and demonstrated an understanding of the instructions.   The patient was advised to call back or seek an in-person evaluation if the symptoms worsen or if the condition fails to improve as anticipated.  I provided 25 minutes of non-face-to-face time during this encounter.  Patient is located at her place of residence during video conference.  Provider is in the office.  Liane Comber, RN helped to facilitate visit.   Debbora Presto, NP

## 2019-05-10 ENCOUNTER — Telehealth: Payer: Self-pay | Admitting: Nurse Practitioner

## 2019-05-10 DIAGNOSIS — Z20822 Contact with and (suspected) exposure to covid-19: Secondary | ICD-10-CM

## 2019-05-10 NOTE — Telephone Encounter (Signed)
Pt scheduled for covid testing tomorrow at [facility] site.  Pt with possible exposure.  Requested by Evern Bio, NP at [facility]  Practices Ph# [phone]  Pts CB# [phone]

## 2019-05-11 ENCOUNTER — Other Ambulatory Visit: Payer: Medicaid Other

## 2019-05-11 DIAGNOSIS — Z20822 Contact with and (suspected) exposure to covid-19: Secondary | ICD-10-CM

## 2019-05-16 LAB — NOVEL CORONAVIRUS, NAA: SARS-CoV-2, NAA: DETECTED — AB

## 2019-05-25 ENCOUNTER — Telehealth: Payer: Self-pay | Admitting: General Practice

## 2019-05-25 NOTE — Telephone Encounter (Signed)
Patient called and stated that she needs her COVID test results showing her name faxed to [phone]

## 2019-06-23 ENCOUNTER — Ambulatory Visit: Payer: Medicaid Other | Admitting: Family Medicine

## 2019-06-23 ENCOUNTER — Telehealth: Payer: Self-pay

## 2019-06-23 NOTE — Telephone Encounter (Signed)
Patient was a no call/no show for their appointment today.   

## 2019-06-23 NOTE — Progress Notes (Deleted)
PATIENT: Faith Hill DOB: Apr 21, 1981  REASON FOR VISIT: follow up HISTORY FROM: patient  No chief complaint on file.    HISTORY OF PRESENT ILLNESS: Today 06/23/19 Faith Hill is a 38 y.o. female here today for follow up for migraines.    HISTORY: (copied from my note on 03/23/2019)  Faith Hill is a 38 y.o. female for follow up.  She reports that she is doing fairly well.  She does continue Emgality injections monthly.  She has not noted significant difference in the frequency of her migraines.  She continues to have daily tension headaches.  She reports at least 2-3 times a week she has migrainous features of light and sound sensitivity as well as nausea.  She states that she has not continued amitriptyline consistently.  She also does not think that she has taken topiramate regularly.  She denies any adverse effects with these medications but states that she is not in her normal routine and has forgotten to take these.  History (copied from Dr Cathren Laine note on 12/14/2018)  [id number] Faith Hill a 38 y.o.femalehere as requested by Dr. Conley Canal migraines. PMHx long-term methadone use, HLD, elevated LFTs, current smoker, prior opioid abuse, 3-6 sodas a day, daily smoker, no alcohol, anxiety, depression. On Reglan and zofran. Headaches since a teenager. Started worsening in the last 18 months with migraines. She has had 2 sleep studies and were negative. Her migraines start on the right behind the eye and temple, She takes excedrin migraine which helps and sleep helps. They can be "debilitating" and moves sides, her neck hurts, pounding and throbbing with heart beat, +photo/phonophobia, nausea and vomiting. Lasts 24-72 hours. They put her in bed. Daily headaches that she wakes up with, they have woken her up in the middle of the night, worse when she bends over, she has 4 migraine days a month. She takes excedrin every other day. She has never  been on a preventative for migraines. She has vision changes with the headaches especially when severe/ No other focal neurologic deficits, associated symptoms, inciting events or modifiable factors. No aura.   Reviewed notes, labs and imaging from outside physicians, which showed:  Notes reviewed from Dr. Charlynn Grimes state TSH, cbc,cmp recently taken will get the results. She has tried imitrex and magnesium. Imitrex did not work. Magnesium over 5-6 weeks did not make any difference. Sleep studies without OSA. She takes ondansetron with a headache. She was prescribed metoclopramide but did not get that.    REVIEW OF SYSTEMS: Out of a complete 14 system review of symptoms, the patient complains only of the following symptoms, and all other reviewed systems are negative.  ALLERGIES: Allergies  Allergen Reactions  . Sulfa Antibiotics Hives and Rash    SULFA DRUGS    HOME MEDICATIONS: Outpatient Medications Prior to Visit  Medication Sig Dispense Refill  . amitriptyline (ELAVIL) 25 MG tablet Take 2 tablets (50 mg total) by mouth at bedtime. 60 tablet 6  . aspirin-acetaminophen-caffeine (EXCEDRIN MIGRAINE) 007-622-63 MG tablet Take by mouth as needed for headache.    . eletriptan (RELPAX) 20 MG tablet Relpax 20 MG Oral Tablet QTY: 12 tablet Days: 30 Refills: 1  Written: 06/02/18 Patient Instructions: Take 1 tablet orally at onset of migraine headache, may repeat 2nd tablet 4 hours later if headache persists    . furosemide (LASIX) 20 MG tablet Take 40 mg by mouth daily.     . Galcanezumab-gnlm (EMGALITY) 120 MG/ML SOAJ Inject 120 mg into  the skin every 30 (thirty) days. 3 pen 3  . glycopyrrolate (ROBINUL) 1 MG tablet Glycopyrrolate 1 MG Oral Tablet QTY: 30 tablet Days: 30 Refills: 1  Written: 10/23/18 Patient Instructions: Take 1 tablet by mouth daily in AM    . METHADONE HCL PO Take 60 mg by mouth daily. Liquid     . terbinafine (LAMISIL) 250 MG tablet Take 1 tablet (250 mg total) by mouth daily.  90 tablet 0  . topiramate (TOPAMAX) 100 MG tablet Start with 1/2 tab at bedtime for 2 weeks then increase to a whole tablet. 30 tablet 11   No facility-administered medications prior to visit.     PAST MEDICAL HISTORY: Past Medical History:  Diagnosis Date  . H/O opioid abuse ([place])    sober since 2009  . Migraine without aura     PAST SURGICAL HISTORY: Past Surgical History:  Procedure Laterality Date  . WRIST SURGERY Bilateral    pins     FAMILY HISTORY: Family History  Problem Relation Age of Onset  . Heart disease Other   . Heart disease Mother     SOCIAL HISTORY: Social History   Socioeconomic History  . Marital status: Single    Spouse name: Not on file  . Number of children: 1  . Years of education: Not on file  . Highest education level: Some college, no degree  Occupational History  . Not on file  Social Needs  . Financial resource strain: Not on file  . Food insecurity    Worry: Not on file    Inability: Not on file  . Transportation needs    Medical: Not on file    Non-medical: Not on file  Tobacco Use  . Smoking status: Former Research scientist (life sciences)  . Smokeless tobacco: Never Used  . Tobacco comment: 1.25-1.5 ppd  Substance and Sexual Activity  . Alcohol use: Not Currently    Frequency: Never  . Drug use: Not Currently    Comment: quit 2009; h/o opioid abuse  . Sexual activity: Yes    Partners: Male    Birth control/protection: None  Lifestyle  . Physical activity    Days per week: Not on file    Minutes per session: Not on file  . Stress: Not on file  Relationships  . Social Herbalist on phone: Not on file    Gets together: Not on file    Attends religious service: Not on file    Active member of club or organization: Not on file    Attends meetings of clubs or organizations: Not on file    Relationship status: Not on file  . Intimate partner violence    Fear of current or ex partner: Not on file    Emotionally abused: Not on file     Physically abused: Not on file    Forced sexual activity: Not on file  Other Topics Concern  . Not on file  Social History Narrative   Caffeine: about 40 oz daily   Lives at home with her daughter and mother lives with them   Right handed      PHYSICAL EXAM  There were no vitals filed for this visit. There is no height or weight on file to calculate BMI.  Generalized: Well developed, in no acute distress  Cardiology: normal rate and rhythm, no murmur noted Neurological examination  Mentation: Alert oriented to time, place, history taking. Follows all commands speech and language fluent Cranial nerve II-XII: Pupils  were equal round reactive to light. Extraocular movements were full, visual field were full on confrontational test. Facial sensation and strength were normal. Uvula tongue midline. Head turning and shoulder shrug  were normal and symmetric. Motor: The motor testing reveals 5 over 5 strength of all 4 extremities. Good symmetric motor tone is noted throughout.  Sensory: Sensory testing is intact to soft touch on all 4 extremities. No evidence of extinction is noted.  Coordination: Cerebellar testing reveals good finger-nose-finger and heel-to-shin bilaterally.  Gait and station: Gait is normal. Tandem gait is normal. Romberg is negative. No drift is seen.  Reflexes: Deep tendon reflexes are symmetric and normal bilaterally.   DIAGNOSTIC DATA (LABS, IMAGING, TESTING) - I reviewed patient records, labs, notes, testing and imaging myself where available.  No flowsheet data found.   No results found for: WBC, HGB, HCT, MCV, PLT No results found for: NA, K, CL, CO2, GLUCOSE, BUN, CREATININE, CALCIUM, PROT, ALBUMIN, AST, ALT, ALKPHOS, BILITOT, GFRNONAA, GFRAA No results found for: CHOL, HDL, LDLCALC, LDLDIRECT, TRIG, CHOLHDL No results found for: HGBA1C No results found for: VITAMINB12 No results found for: TSH     ASSESSMENT AND PLAN 38 y.o. year old female  has a past  medical history of H/O opioid abuse ([place]) and Migraine without aura. here with ***    ICD-10-CM   1. Migraine without aura and without status migrainosus, not intractable  G43.009        No orders of the defined types were placed in this encounter.    No orders of the defined types were placed in this encounter.     I spent 15 minutes with the patient. 50% of this time was spent counseling and educating patient on plan of care and medications.    Debbora Presto, FNP-C 06/23/2019, 9:47 AM [facility] [address] [place], [place] [address] [phone]

## 2019-12-27 ENCOUNTER — Other Ambulatory Visit: Payer: Self-pay | Admitting: Nurse Practitioner

## 2019-12-27 DIAGNOSIS — Z1231 Encounter for screening mammogram for malignant neoplasm of breast: Secondary | ICD-10-CM

## 2020-01-10 ENCOUNTER — Ambulatory Visit
Admission: RE | Admit: 2020-01-10 | Discharge: 2020-01-10 | Disposition: A | Discharge status: Home / Self Care | Payer: Medicaid Other | Source: Ambulatory Visit | Attending: Nurse Practitioner | Admitting: Nurse Practitioner

## 2020-01-10 DIAGNOSIS — Z1231 Encounter for screening mammogram for malignant neoplasm of breast: Secondary | ICD-10-CM | POA: Diagnosis present

## 2020-01-31 DIAGNOSIS — I340 Nonrheumatic mitral (valve) insufficiency: Secondary | ICD-10-CM | POA: Insufficient documentation

## 2020-02-22 ENCOUNTER — Other Ambulatory Visit (INDEPENDENT_AMBULATORY_CARE_PROVIDER_SITE_OTHER): Payer: Self-pay | Admitting: Nurse Practitioner

## 2020-02-22 DIAGNOSIS — I83893 Varicose veins of bilateral lower extremities with other complications: Secondary | ICD-10-CM

## 2020-02-22 DIAGNOSIS — I781 Nevus, non-neoplastic: Secondary | ICD-10-CM

## 2020-02-25 ENCOUNTER — Encounter (INDEPENDENT_AMBULATORY_CARE_PROVIDER_SITE_OTHER): Payer: Self-pay | Admitting: Nurse Practitioner

## 2020-02-25 ENCOUNTER — Other Ambulatory Visit: Payer: Self-pay

## 2020-02-25 ENCOUNTER — Ambulatory Visit (INDEPENDENT_AMBULATORY_CARE_PROVIDER_SITE_OTHER): Payer: Medicaid Other

## 2020-02-25 ENCOUNTER — Ambulatory Visit (INDEPENDENT_AMBULATORY_CARE_PROVIDER_SITE_OTHER): Payer: Medicaid Other | Admitting: Nurse Practitioner

## 2020-02-25 VITALS — BP 132/90 | HR 90 | Resp 16 | Ht 71.0 in | Wt 204.8 lb

## 2020-02-25 DIAGNOSIS — I890 Lymphedema, not elsewhere classified: Secondary | ICD-10-CM | POA: Diagnosis not present

## 2020-02-25 DIAGNOSIS — I781 Nevus, non-neoplastic: Secondary | ICD-10-CM

## 2020-02-25 DIAGNOSIS — I83893 Varicose veins of bilateral lower extremities with other complications: Secondary | ICD-10-CM | POA: Diagnosis not present

## 2020-03-01 ENCOUNTER — Encounter (INDEPENDENT_AMBULATORY_CARE_PROVIDER_SITE_OTHER): Payer: Self-pay | Admitting: Nurse Practitioner

## 2020-03-01 NOTE — Progress Notes (Signed)
Subjective:    Patient ID: Faith Hill, female    DOB: January 30, 1981, 39 y.o.   MRN: [id number] Chief Complaint  Patient presents with  . New Patient (Initial Visit)    ref Charlynn Grimes varicose veins    Patient is seen for evaluation of leg pain and leg swelling. The patient first noticed the swelling remotely. The swelling is associated with pain and discoloration. The pain and swelling worsens with prolonged dependency and improves with elevation. The pain is unrelated to activity.  The patient notes that in the morning the legs are significantly improved but they steadily worsened throughout the course of the day. The patient also notes a steady worsening of the discoloration in the ankle and shin area.   The patient denies claudication symptoms.  The patient denies symptoms consistent with rest pain.  The patient denies and extensive history of DJD and LS spine disease.  The patient has no had any past angiography, interventions or vascular surgery.  Elevation makes the leg symptoms better, dependency makes them much worse. There is no history of ulcerations. The patient denies any recent changes in medications.  The patient has been wearing graduated compression and noticed that it does help with swelling some but despite use it has not helped control the pain and swelling   The patient denies a history of DVT or PE. There is no prior history of phlebitis. There is no history of primary lymphedema.  No history of malignancies. No history of trauma or groin or pelvic surgery. There is no history of radiation treatment to the groin or pelvis  The patient denies amaurosis fugax or recent TIA symptoms. There are no recent neurological changes noted.   The patient underwent a bilateral lower extremity venous reflux which revealed no evidence of DVT or superficial venous thrombosis bilaterally.  No evidence of deep venous insufficiency.  No evidence of superficial venous reflux.    Review of Systems  Cardiovascular: Positive for leg swelling.  All other systems reviewed and are negative.      Objective:   Physical Exam Vitals reviewed.  Cardiovascular:     Rate and Rhythm: Normal rate and regular rhythm.     Pulses:          Dorsalis pedis pulses are 2+ on the right side and 2+ on the left side.       Posterior tibial pulses are 2+ on the right side and 2+ on the left side.  Pulmonary:     Effort: Pulmonary effort is normal.     Breath sounds: Normal breath sounds.  Musculoskeletal:     Right lower leg: 2+ Pitting Edema present.     Left lower leg: 2+ Pitting Edema present.  Neurological:     Mental Status: She is alert and oriented to person, place, and time.  Psychiatric:        Mood and Affect: Mood normal.        Behavior: Behavior normal.        Thought Content: Thought content normal.        Judgment: Judgment normal.     BP 132/90 (BP Location: Right Arm)   Pulse 90   Resp 16   Ht 5\' 11"  (1.803 m)   Wt 204 lb 12.8 oz (92.9 kg)   BMI 28.56 kg/m   Past Medical History:  Diagnosis Date  . H/O opioid abuse ([place])    sober since 2009  . Migraine without aura  Social History   Socioeconomic History  . Marital status: Single    Spouse name: Not on file  . Number of children: 1  . Years of education: Not on file  . Highest education level: Some college, no degree  Occupational History  . Not on file  Tobacco Use  . Smoking status: Former Research scientist (life sciences)  . Smokeless tobacco: Never Used  . Tobacco comment: 1.25-1.5 ppd  Substance and Sexual Activity  . Alcohol use: Not Currently  . Drug use: Not Currently    Comment: quit 2009; h/o opioid abuse  . Sexual activity: Yes    Partners: Male    Birth control/protection: None  Other Topics Concern  . Not on file  Social History Narrative   Caffeine: about 40 oz daily   Lives at home with her daughter and mother lives with them   Right handed   Social Determinants of Health   Financial  Resource Strain:   . Difficulty of Paying Living Expenses:   Food Insecurity:   . Worried About Charity fundraiser in the Last Year:   . Arboriculturist in the Last Year:   Transportation Needs:   . Film/video editor (Medical):   Marland Kitchen Lack of Transportation (Non-Medical):   Physical Activity:   . Days of Exercise per Week:   . Minutes of Exercise per Session:   Stress:   . Feeling of Stress :   Social Connections:   . Frequency of Communication with Friends and Family:   . Frequency of Social Gatherings with Friends and Family:   . Attends Religious Services:   . Active Member of Clubs or Organizations:   . Attends Archivist Meetings:   Marland Kitchen Marital Status:   Intimate Partner Violence:   . Fear of Current or Ex-Partner:   . Emotionally Abused:   Marland Kitchen Physically Abused:   . Sexually Abused:     Past Surgical History:  Procedure Laterality Date  . WRIST SURGERY Bilateral    pins     Family History  Problem Relation Age of Onset  . Heart disease Other   . Heart disease Mother   . Breast cancer Sister 60    Allergies  Allergen Reactions  . Sulfa Antibiotics Hives and Rash    SULFA DRUGS       Assessment & Plan:   1. Lymphedema Recommend:  No surgery or intervention at this point in time.    I have reviewed my previous discussion with the patient regarding swelling and why it causes symptoms.  Patient will continue wearing graduated compression stockings class 1 (20-30 mmHg) on a daily basis. The patient will  beginning wearing the stockings first thing in the morning and removing them in the evening. The patient is instructed specifically not to sleep in the stockings.    In addition, behavioral modification including several periods of elevation of the lower extremities during the day will be continued.  This was reviewed with the patient during the initial visit.  The patient will also continue routine exercise, especially walking on a daily basis as  was discussed during the initial visit.    Despite conservative treatments of at least 4 weeks, including graduated compression therapy class 1 and behavioral modification including exercise and elevation the patient  has not obtained adequate control of the lymphedema.  The patient still has stage 3 lymphedema and therefore, I believe that a lymph pump should be added to improve the control of the  patient's lymphedema.  Additionally, a lymph pump is warranted because it will reduce the risk of cellulitis and ulceration in the future.  Patient should follow-up in six months     Current Outpatient Medications on File Prior to Visit  Medication Sig Dispense Refill  . METHADONE HCL PO Take 60 mg by mouth daily. Liquid     . MULTIPLE VITAMIN PO Take by mouth.    Marland Kitchen amitriptyline (ELAVIL) 25 MG tablet Take 2 tablets (50 mg total) by mouth at bedtime. (Patient not taking: Reported on 02/25/2020) 60 tablet 6  . aspirin-acetaminophen-caffeine (EXCEDRIN MIGRAINE) [id number] MG tablet Take by mouth as needed for headache.    . eletriptan (RELPAX) 20 MG tablet Relpax 20 MG Oral Tablet QTY: 12 tablet Days: 30 Refills: 1  Written: 06/02/18 Patient Instructions: Take 1 tablet orally at onset of migraine headache, may repeat 2nd tablet 4 hours later if headache persists    . furosemide (LASIX) 20 MG tablet Take 40 mg by mouth daily.     . Galcanezumab-gnlm (EMGALITY) 120 MG/ML SOAJ Inject 120 mg into the skin every 30 (thirty) days. (Patient not taking: Reported on 02/25/2020) 3 pen 3  . glycopyrrolate (ROBINUL) 1 MG tablet Glycopyrrolate 1 MG Oral Tablet QTY: 30 tablet Days: 30 Refills: 1  Written: 10/23/18 Patient Instructions: Take 1 tablet by mouth daily in AM    . terbinafine (LAMISIL) 250 MG tablet Take 1 tablet (250 mg total) by mouth daily. (Patient not taking: Reported on 02/25/2020) 90 tablet 0  . topiramate (TOPAMAX) 100 MG tablet Start with 1/2 tab at bedtime for 2 weeks then increase to a whole tablet.  (Patient not taking: Reported on 02/25/2020) 30 tablet 11   No current facility-administered medications on file prior to visit.    There are no Patient Instructions on file for this visit. No follow-ups on file.   Kris Hartmann, NP

## 2020-03-21 ENCOUNTER — Other Ambulatory Visit: Payer: Self-pay

## 2020-03-21 ENCOUNTER — Ambulatory Visit (INDEPENDENT_AMBULATORY_CARE_PROVIDER_SITE_OTHER): Payer: Medicaid Other | Admitting: Obstetrics and Gynecology

## 2020-03-21 ENCOUNTER — Encounter: Payer: Self-pay | Admitting: Obstetrics and Gynecology

## 2020-03-21 VITALS — BP 134/86 | HR 85 | Ht 70.5 in | Wt 207.5 lb

## 2020-03-21 DIAGNOSIS — N72 Inflammatory disease of cervix uteri: Secondary | ICD-10-CM

## 2020-03-21 DIAGNOSIS — B977 Papillomavirus as the cause of diseases classified elsewhere: Secondary | ICD-10-CM | POA: Diagnosis not present

## 2020-03-21 NOTE — Progress Notes (Signed)
HPI:      [name] is a 39 y.o. No obstetric history on file. who LMP was Patient's last menstrual period was 03/14/2020.  Subjective:   She presents today as referral for abnormal Pap smear revealing normal cytology but type 16 HPV present.  Patient states this is her first abnormal Pap smear. She does have a history of HSV She smoked cigarettes for much of her life but has been vaping for the last 8 years.    Hx: The following portions of the patient's history were reviewed and updated as appropriate:             She  has a past medical history of H/O opioid abuse ([place]) and Migraine without aura. She does not have any pertinent problems on file. She  has a past surgical history that includes Wrist surgery (Bilateral). Her family history includes Breast cancer (age of onset: 35) in her sister; Heart disease in her mother and another family member. She  reports that she has quit smoking. She has never used smokeless tobacco. She reports previous alcohol use. She reports previous drug use. She has a current medication list which includes the following prescription(s): furosemide, galcanezumab-gnlm, methadone hcl, multiple vitamin, and glycopyrrolate. She is allergic to sulfa antibiotics.       Review of Systems:  Review of Systems  Constitutional: Denied constitutional symptoms, night sweats, recent illness, fatigue, fever, insomnia and weight loss.  Eyes: Denied eye symptoms, eye pain, photophobia, vision change and visual disturbance.  Ears/Nose/Throat/Neck: Denied ear, nose, throat or neck symptoms, hearing loss, nasal discharge, sinus congestion and sore throat.  Cardiovascular: Denied cardiovascular symptoms, arrhythmia, chest pain/pressure, edema, exercise intolerance, orthopnea and palpitations.  Respiratory: Denied pulmonary symptoms, asthma, pleuritic pain, productive sputum, cough, dyspnea and wheezing.  Gastrointestinal: Denied, gastro-esophageal reflux, melena,  nausea and vomiting.  Genitourinary: Denied genitourinary symptoms including symptomatic vaginal discharge, pelvic relaxation issues, and urinary complaints.  Musculoskeletal: Denied musculoskeletal symptoms, stiffness, swelling, muscle weakness and myalgia.  Dermatologic: Denied dermatology symptoms, rash and scar.  Neurologic: Denied neurology symptoms, dizziness, headache, neck pain and syncope.  Psychiatric: Denied psychiatric symptoms, anxiety and depression.  Endocrine: Denied endocrine symptoms including hot flashes and night sweats.   Meds:   Current Outpatient Medications on File Prior to Visit  Medication Sig Dispense Refill  . furosemide (LASIX) 20 MG tablet Take 40 mg by mouth daily.     . Galcanezumab-gnlm (EMGALITY) 120 MG/ML SOAJ Inject 120 mg into the skin every 30 (thirty) days. 3 pen 3  . METHADONE HCL PO Take 60 mg by mouth daily. Liquid     . MULTIPLE VITAMIN PO Take by mouth.    Marland Kitchen glycopyrrolate (ROBINUL) 1 MG tablet Glycopyrrolate 1 MG Oral Tablet QTY: 30 tablet Days: 30 Refills: 1  Written: 10/23/18 Patient Instructions: Take 1 tablet by mouth daily in AM     No current facility-administered medications on file prior to visit.    Objective:     Vitals:   03/21/20 0920  BP: 134/86  Pulse: 85              Pap smear results reviewed in detail  Assessment:    No obstetric history on file. Patient Active Problem List   Diagnosis Date Noted  . Mitral regurgitation 01/31/2020  . Chronic migraine without aura, with intractable migraine, so stated, with status migrainosus 12/14/2018  . Migraine without aura and without status migrainosus, not intractable 08/12/2018  . Medication overuse headache 08/12/2018  .  Hyperlipidemia 06/02/2018  . 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 Varicose veins of both lower extremities 03/20/2017  . Low back pain 08/30/2016  . Other headache syndrome 08/07/2016  . Constipation 09/26/2015  . Opioid dependence in remission ([place])  11/29/2014  . Allergic rhinitis 06/24/2014  . Anxiety state 08/26/2012     1. 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  We have discussed the natural course and history of HPV in detail.  Its relationship to cervical dysplasia and cervical cancer discussed.  I have recommended either colposcopy now with biopsies or expectant management with follow-up Pap smear in 1 year.  (Both of these strategies appear to be acceptable per ASCCP guidelines)  I do suspect that in 1 year she will show some sign of dysplasia on her Pap.   The risks benefits and possible scenarios based on both of these strategies were discussed in detail with the patient.  She has decided to follow-up Pap smear in 1 year. Orders No orders of the defined types were placed in this encounter.   No orders of the defined types were placed in this encounter.  I spent 31 minutes involved in the care of this patient preparing to see the patient by obtaining and reviewing her medical history (including labs, imaging tests and prior procedures), documenting clinical information in the electronic health record (EHR), counseling and coordinating care plans, writing and sending prescriptions, ordering tests or procedures and directly communicating with the patient by discussing pertinent items from her history and physical exam as well as detailing my assessment and plan as noted above so that she has an informed understanding.  All of her questions were answered.    F/U  Return for Annual Physical.  Finis Bud, M.D. 03/21/2020 10:01 AM

## 2020-04-06 ENCOUNTER — Telehealth: Payer: Self-pay | Admitting: Family Medicine

## 2020-04-06 ENCOUNTER — Telehealth: Payer: Self-pay | Admitting: Neurology

## 2020-04-06 DIAGNOSIS — G43009 Migraine without aura, not intractable, without status migrainosus: Secondary | ICD-10-CM

## 2020-04-06 MED ORDER — EMGALITY 120 MG/ML ~~LOC~~ SOAJ: 120.0000 mg | SUBCUTANEOUS | 0 refills | Status: DC

## 2020-04-06 NOTE — Telephone Encounter (Signed)
Pt called needing to speak to RN about her Galcanezumab-gnlm (EMGALITY) 120 MG/ML SOAJ Pt states that the pharmacy received a message stating that the pt was unknown and now they will not refill her medication. Please advise.

## 2020-04-06 NOTE — Telephone Encounter (Signed)
Pt has called asking why only 1 month of the Galcanezumab-gnlm (EMGALITY) 120 MG/ML SOAJ was called in.  Pt was read the my chart message from RN.  When pt was offered the 1st available appointment with Amy,NP pt expressed her dissatisfaction in having to wait until Sept.(inspite of being given option of the wait list) Pt stated she has kept all of her appointments and it is not right that she should have to go thru this.  Pt was reminded that she No showed her 06-23-19 f/u that was scheduled as her 3 month f/u.  Pt then stated don't worry about it, she will contact her primary Dr for the medication and then ended the call.

## 2020-04-06 NOTE — Telephone Encounter (Addendum)
I called pt and made appt with JM/NP for migraines, med refill. 04-13-20 at 1315. ([place] no availability).

## 2020-04-06 NOTE — Telephone Encounter (Signed)
I do not see any record of our office sending pharmacy a message saying the patient is unknown. She was last seen May 2020 and no showed follow up August 2020. I can provide one refill but the pt needs an appointment for further refills.

## 2020-04-13 ENCOUNTER — Encounter: Payer: Self-pay | Admitting: Adult Health

## 2020-04-13 ENCOUNTER — Ambulatory Visit: Payer: Self-pay | Admitting: Adult Health

## 2020-04-13 NOTE — Progress Notes (Deleted)
[facility]    Provider:  Dr Jaynee Eagles Referring Provider: Danelle Berry, NP Primary Care Physician:  Danelle Berry, NP  CC:  Migraines   Today, 04/10/2020, Faith Hill returns for follow-up regarding migraines and medication refills.  She was a no-show to prior scheduled visit on 06/23/2019.  Since prior visit, ***.  She continues on Emgality injections monthly.  Recommended compliance with topiramate and amitriptyline ***.     History copied for reference purposes only Virtual visit 03/23/2019 [place]: Faith Hill is a 39 y.o. female for follow up.  She reports that she is doing fairly well.  She does continue Emgality injections monthly.  She has not noted significant difference in the frequency of her migraines.  She continues to have daily tension headaches.  She reports at least 2-3 times a week she has migrainous features of light and sound sensitivity as well as nausea.  She states that she has not continued amitriptyline consistently.  She also does not think that she has taken topiramate regularly.  She denies any adverse effects with these medications but states that she is not in her normal routine and has forgotten to take these.  Interval history 12/14/2018 Dr. Jaynee Eagles: Tried Amitriptyline didn;t work. Still continues to overuse OTC medications. Had a medrol dosepak. The amitriptyline didn't help. Will try topiramate. Also CGRP has evidence that it can help with migraines and medictaion overuse will give samples for 3 months.   Initial consult visit 08/12/2018 Dr. Jaynee Eagles:  Faith Hill is a 39 y.o. female here as requested by Dr. Charlynn Grimes for migraines. PMHx long-term methadone use, HLD, elevated LFTs, current smoker, prior opioid abuse, 3-6 sodas a day, daily smoker, no alcohol, anxiety, depression. On Reglan and zofran. Headaches since a teenager. Started worsening in the last 18 months with migraines. She has had 2 sleep studies and were negative.  Her migraines start on the right behind the eye and temple, She takes excedrin migraine which helps and sleep helps. They can be "debilitating" and moves sides, her neck hurts, pounding and throbbing with heart beat, +photo/phonophobia, nausea and vomiting. Lasts 24-72 hours. They put her in bed. Daily headaches that she wakes up with, they have woken her up in the middle of the night, worse when she bends over, she has 4 migraine days a month. She takes excedrin every other day. She has never been on a preventative for migraines. She has vision changes with the headaches especially when severe/ No other focal neurologic deficits, associated symptoms, inciting events or modifiable factors. No aura.   Reviewed notes, labs and imaging from outside physicians, which showed:  Notes reviewed from Dr. Charlynn Grimes state TSH, cbc,cmp recently taken will get the results. She has tried imitrex and magnesium. Imitrex did not work. Magnesium over 5-6 weeks did not make any difference. Sleep studies without OSA. She takes ondansetron with a headache. She was prescribed metoclopramide but did not get that.   Review of Systems: Patient complains of symptoms per HPI as well as the following symptoms: insomnia, memory loss, headache, weakness, racing thought. Pertinent negatives and positives per HPI. All others negative.   Social History   Socioeconomic History  . Marital status: Single    Spouse name: Not on file  . Number of children: 1  . Years of education: Not on file  . Highest education level: Some college, no degree  Occupational History  . Not on file  Tobacco Use  . Smoking status: Former Research scientist (life sciences)  .  Smokeless tobacco: Never Used  . Tobacco comment: 1.25-1.5 ppd  Vaping Use  . Vaping Use: Every day  . Start date: 11/04/2010  Substance and Sexual Activity  . Alcohol use: Not Currently  . Drug use: Not Currently    Comment: quit 2009; h/o opioid abuse  . Sexual activity: Yes    Partners: Male     Birth control/protection: None  Other Topics Concern  . Not on file  Social History Narrative   Caffeine: about 40 oz daily   Lives at home with her daughter and mother lives with them   Right handed   Social Determinants of Health   Financial Resource Strain:   . Difficulty of Paying Living Expenses:   Food Insecurity:   . Worried About Charity fundraiser in the Last Year:   . Arboriculturist in the Last Year:   Transportation Needs:   . Film/video editor (Medical):   Marland Kitchen Lack of Transportation (Non-Medical):   Physical Activity:   . Days of Exercise per Week:   . Minutes of Exercise per Session:   Stress:   . Feeling of Stress :   Social Connections:   . Frequency of Communication with Friends and Family:   . Frequency of Social Gatherings with Friends and Family:   . Attends Religious Services:   . Active Member of Clubs or Organizations:   . Attends Archivist Meetings:   Marland Kitchen Marital Status:   Intimate Partner Violence:   . Fear of Current or Ex-Partner:   . Emotionally Abused:   Marland Kitchen Physically Abused:   . Sexually Abused:     Family History  Problem Relation Age of Onset  . Heart disease Other   . Heart disease Mother   . Breast cancer Sister 68    Past Medical History:  Diagnosis Date  . H/O opioid abuse ([place])    sober since 2009  . Migraine without aura     Past Surgical History:  Procedure Laterality Date  . WRIST SURGERY Bilateral    pins     Current Outpatient Medications  Medication Sig Dispense Refill  . furosemide (LASIX) 20 MG tablet Take 40 mg by mouth daily.     . Galcanezumab-gnlm (EMGALITY) 120 MG/ML SOAJ Inject 120 mg into the skin every 30 (thirty) days. 1 pen 0  . glycopyrrolate (ROBINUL) 1 MG tablet Glycopyrrolate 1 MG Oral Tablet QTY: 30 tablet Days: 30 Refills: 1  Written: 10/23/18 Patient Instructions: Take 1 tablet by mouth daily in AM    . METHADONE HCL PO Take 60 mg by mouth daily. Liquid     . MULTIPLE VITAMIN PO  Take by mouth.     No current facility-administered medications for this visit.    Allergies as of 04/13/2020 - Review Complete 03/21/2020  Allergen Reaction Noted  . Sulfa antibiotics Hives and Rash 08/12/2018    Vitals: LMP 03/14/2020  Last Weight:  Wt Readings from Last 1 Encounters:  03/21/20 207 lb 8 oz (94.1 kg)   Last Height:   Ht Readings from Last 1 Encounters:  03/21/20 5' 10.5" (1.791 m)   Physical exam: Exam: Gen: NAD, conversant, well nourised, well groomed                     CV: RRR, no MRG. No Carotid Bruits. + mild distal peripheral edema, warm, nontender Eyes: Conjunctivae clear without exudates or hemorrhage  Neuro: Detailed Neurologic Exam  Speech:  Speech is normal; fluent and spontaneous with normal comprehension.  Cognition:    The patient is oriented to person, place, and time;     recent and remote memory intact;     language fluent;     normal attention, concentration,     fund of knowledge Cranial Nerves:    The pupils are equal, round, and reactive to light. The fundi are normal and spontaneous venous pulsations are present. Visual fields are full to finger confrontation. Extraocular movements are intact. Trigeminal sensation is intact and the muscles of mastication are normal. The face is symmetric. The palate elevates in the midline. Hearing intact. Voice is normal. Shoulder shrug is normal. The tongue has normal motion without fasciculations.   Coordination:    Normal finger to nose and heel to shin. Normal rapid alternating movements.   Gait:    Heel-toe and tandem gait are normal.   Motor Observation:    No asymmetry, no atrophy, and no involuntary movements noted. Tone:    Normal muscle tone.    Posture:    Posture is normal. normal erect    Strength:    Strength is V/V in the upper and lower limbs.      Sensation: intact to LT     Reflex Exam:  DTR's:    Deep tendon reflexes in the upper and lower extremities are  normal bilaterally.   Toes:    The toes are downgoing bilaterally.   Clonus:    Clonus is absent.       Assessment/Plan:  39 year old with migraines and medication overuse  No diagnosis found.    Tried and failed Amitriptyline Start Topiramate Provided 3 months samples of Emgality (there is evidence this can help with migraines and medictaion overuse)  - 2 sleep studies normal - MRI brain due to concerning symptoms of morning headaches, positional headaches,vision changes  to look for space occupying mass, chiari or intracranial hypertension (pseudotumor). NORMAL. - Takes excedrin every other day, discussed medication overuse. She has reduced her intake - Ca continue Amitriptyline since she has severe insomnia which it can help with as well.  - Stop Excedrin and other OTC meds and excessive caffeine (discussed in detail) - She sees an ENT for sinus problems - A medrol dosepak to bridge last time, will not repeat - following with cardiology for swelling, no heart arrythmias please discuss amitriptyline with cardiologist (sees them tomorrow)  No orders of the defined types were placed in this encounter.  Discussed teratogenicity of above, do not get pregnant  Discussed: To prevent or relieve headaches, try the following: Cool Compress. Lie down and place a cool compress on your head.  Avoid headache triggers. If certain foods or odors seem to have triggered your migraines in the past, avoid them. A headache diary might help you identify triggers.  Include physical activity in your daily routine. Try a daily walk or other moderate aerobic exercise.  Manage stress. Find healthy ways to cope with the stressors, such as delegating tasks on your to-do list.  Practice relaxation techniques. Try deep breathing, yoga, massage and visualization.  Eat regularly. Eating regularly scheduled meals and maintaining a healthy diet might help prevent headaches. Also, drink plenty of fluids.    Follow a regular sleep schedule. Sleep deprivation might contribute to headaches Consider biofeedback. With this mind-body technique, you learn to control certain bodily functions -- such as muscle tension, heart rate and blood pressure -- to prevent headaches or reduce headache pain.  Proceed to emergency room if you experience new or worsening symptoms or symptoms do not resolve, if you have new neurologic symptoms or if headache is severe, or for any concerning symptom.   Provided education and documentation from American headache Society toolbox including articles on: chronic migraine medication overuse headache, chronic migraines, prevention of migraines, behavioral and other nonpharmacologic treatments for headache.    I spent *** minutes of face-to-face and non-face-to-face time with patient.  This included previsit chart review, lab review, study review, order entry, electronic health record documentation, patient education    Frann Rider, [facility]  [facility] [address] [place], [place] [address]  Phone [phone] Fax [phone] Note: This document was prepared with digital dictation and possible smart phrase technology. Any transcriptional errors that result from this process are unintentional.

## 2020-08-25 ENCOUNTER — Ambulatory Visit (INDEPENDENT_AMBULATORY_CARE_PROVIDER_SITE_OTHER): Payer: Medicaid Other | Admitting: Vascular Surgery

## 2020-08-28 ENCOUNTER — Ambulatory Visit (INDEPENDENT_AMBULATORY_CARE_PROVIDER_SITE_OTHER): Payer: Medicaid Other | Admitting: Nurse Practitioner

## 2020-10-26 ENCOUNTER — Other Ambulatory Visit: Payer: Self-pay

## 2020-10-26 ENCOUNTER — Ambulatory Visit
Admission: RE | Admit: 2020-10-26 | Discharge: 2020-10-26 | Disposition: A | Discharge status: Home / Self Care | Payer: Medicaid Other | Source: Ambulatory Visit | Attending: Emergency Medicine | Admitting: Emergency Medicine

## 2020-10-26 VITALS — BP 136/83 | HR 85 | Temp 98.6°F | Resp 18 | Ht 71.0 in | Wt 187.0 lb

## 2020-10-26 DIAGNOSIS — Z3202 Encounter for pregnancy test, result negative: Secondary | ICD-10-CM | POA: Diagnosis not present

## 2020-10-26 DIAGNOSIS — N760 Acute vaginitis: Secondary | ICD-10-CM | POA: Diagnosis not present

## 2020-10-26 DIAGNOSIS — B9689 Other specified bacterial agents as the cause of diseases classified elsewhere: Secondary | ICD-10-CM

## 2020-10-26 DIAGNOSIS — N751 Abscess of Bartholin's gland: Secondary | ICD-10-CM

## 2020-10-26 DIAGNOSIS — Z113 Encounter for screening for infections with a predominantly sexual mode of transmission: Secondary | ICD-10-CM

## 2020-10-26 LAB — URINALYSIS, COMPLETE (UACMP) WITH MICROSCOPIC
Glucose, UA: NEGATIVE mg/dL
Nitrite: NEGATIVE
Protein, ur: NEGATIVE mg/dL
Specific Gravity, Urine: 1.02 (ref 1.005–1.030)
pH: 6 (ref 5.0–8.0)

## 2020-10-26 LAB — CHLAMYDIA/NGC RT PCR (ARMC ONLY)
Chlamydia Tr: NOT DETECTED
N gonorrhoeae: NOT DETECTED

## 2020-10-26 LAB — WET PREP, GENITAL
Sperm: NONE SEEN
Trich, Wet Prep: NONE SEEN
Yeast Wet Prep HPF POC: NONE SEEN

## 2020-10-26 LAB — PREGNANCY, URINE: Preg Test, Ur: NEGATIVE

## 2020-10-26 MED ORDER — METRONIDAZOLE 500 MG PO TABS: 500.0000 mg | ORAL_TABLET | Freq: Three times a day (TID) | ORAL | 0 refills | Status: AC

## 2020-10-26 MED ORDER — IBUPROFEN 600 MG PO TABS: 600.0000 mg | ORAL_TABLET | Freq: Four times a day (QID) | ORAL | 0 refills | Status: DC | PRN

## 2020-10-26 MED ORDER — HIBICLENS 4 % EX LIQD: Freq: Every day | CUTANEOUS | 0 refills | Status: DC | PRN

## 2020-10-26 MED ORDER — DOXYCYCLINE HYCLATE 100 MG PO CAPS: 100.0000 mg | ORAL_CAPSULE | Freq: Two times a day (BID) | ORAL | 0 refills | Status: AC

## 2020-10-26 NOTE — ED Triage Notes (Signed)
Patient states that she has been having a bartholins cyst x yesterday morning. States that she has had one in the past. States that the pain is worse with walking.

## 2020-10-26 NOTE — ED Provider Notes (Signed)
HPI  SUBJECTIVE:  Faith Hill is a 39 y.o. female who presents with left labial swelling, pain starting yesterday.  She states that the pain is constant, it has not changed since it started.  She reports there is vaginal discharge as well.  States that the labial swelling has not changed in size.  No nausea, vomiting, fevers.  No vaginal bleeding.  No genital rash, itching.  No pelvic pain.  She has 2 female sexual partners.  She had a new partner last week, states that they use condoms.  He is asymptomatic to her knowledge.  Her other partner also has another female partner, he is asymptomatic to her knowledge.  Would however like to be screened for STDs.  She has not tried anything for this.  No alleviating factors.  Symptoms worse with walking. Patient has a past medical history of gonorrhea, herpes, trichomonas, yeast, Bartholin cyst 20 years ago.  Not sure if it was on the left or right.  Required I&D and Word catheter placement.  Also history of opiate abuse in remission, currently on methadone.  No history of chlamydia, HIV, syphilis, BV, diabetes, hypertension.  LMP: 12/7.  Not sure if she could be pregnant.  MLY:[id number], Ardis Rowan, NP gynecologist: Unable to remember name, located in [place].    Past Medical History:  Diagnosis Date  . H/O opioid abuse ([place])    sober since 2009  . Migraine without aura     Past Surgical History:  Procedure Laterality Date  . WRIST SURGERY Bilateral    pins     Family History  Problem Relation Age of Onset  . Heart disease Other   . Heart disease Mother   . Breast cancer Sister 49    Social History   Tobacco Use  . Smoking status: Former Research scientist (life sciences)  . Smokeless tobacco: Never Used  . Tobacco comment: 1.25-1.5 ppd  Vaping Use  . Vaping Use: Every day  . Start date: 11/04/2010  Substance Use Topics  . Alcohol use: Not Currently  . Drug use: Not Currently    Comment: quit 2009; h/o opioid abuse    No current facility-administered  medications for this encounter.  Current Outpatient Medications:  .  furosemide (LASIX) 20 MG tablet, Take 40 mg by mouth daily. , Disp: , Rfl:  .  METHADONE HCL PO, Take 60 mg by mouth daily. Liquid, Disp: , Rfl:  .  MULTIPLE VITAMIN PO, Take by mouth., Disp: , Rfl:  .  chlorhexidine (HIBICLENS) 4 % external liquid, Apply topically daily as needed. Dilute 10-15 mL in water, Use daily when bathing for 1-2 weeks, Disp: 120 mL, Rfl: 0 .  doxycycline (VIBRAMYCIN) 100 MG capsule, Take 1 capsule (100 mg total) by mouth 2 (two) times daily for 7 days., Disp: 14 capsule, Rfl: 0 .  Galcanezumab-gnlm (EMGALITY) 120 MG/ML SOAJ, Inject 120 mg into the skin every 30 (thirty) days., Disp: 1 pen, Rfl: 0 .  ibuprofen (ADVIL) 600 MG tablet, Take 1 tablet (600 mg total) by mouth every 6 (six) hours as needed., Disp: 30 tablet, Rfl: 0 .  metroNIDAZOLE (FLAGYL) 500 MG tablet, Take 1 tablet (500 mg total) by mouth 3 (three) times daily for 7 days., Disp: 21 tablet, Rfl: 0 .  spironolactone (ALDACTONE) 25 MG tablet, Take 25 mg by mouth daily., Disp: , Rfl:   Allergies  Allergen Reactions  . Sulfa Antibiotics Hives and Rash    SULFA DRUGS     ROS  As noted in  HPI.   Physical Exam  BP 136/83 (BP Location: Right Arm)   Pulse 85   Temp 98.6 F (37 C) (Oral)   Resp 18   Ht 5\' 11"  (1.803 m)   Wt 84.8 kg   LMP 10/10/2020   SpO2 100%   BMI 26.08 kg/m   Constitutional: Well developed, well nourished, appears uncomfortable Eyes:  EOMI, conjunctiva normal bilaterally HENT: Normocephalic, atraumatic,mucus membranes moist Respiratory: Normal inspiratory effort Cardiovascular: Normal rate GI: nondistended soft, nontender. No suprapubic tenderness  back: No CVA tenderness GU: Erythematous, swollen, tender left labia.  Extensive white purulent discharge external genitalia.  Well-healed sinus tract with expressible purulent drainage in the Bartholin's area.  Patient unable to tolerate speculum exam.   Chaperone present during exam skin: No rash, skin intact Musculoskeletal: no deformities Neurologic: Alert & oriented x 3, no focal neuro deficits Psychiatric: Speech and behavior appropriate   ED Course   Medications - No data to display  Orders Placed This Encounter  Procedures  . Chlamydia/NGC rt PCR ([facility] only)    Standing Status:   Standing    Number of Occurrences:   1  . Wet prep, genital    Standing Status:   Standing    Number of Occurrences:   1  . Aerobic Culture (superficial specimen)    Standing Status:   Standing    Number of Occurrences:   1  . Pregnancy, urine    Standing Status:   Standing    Number of Occurrences:   1  . Urinalysis, Complete w Microscopic    Only need to do if pregnant, if upreg neg do not do UA/micro    Standing Status:   Standing    Number of Occurrences:   1    Results for orders placed or performed during the hospital encounter of 10/26/20 (from the past 24 hour(s))  Wet prep, genital     Status: Abnormal   Collection Time: 10/26/20  5:44 PM   Specimen: Vaginal  Result Value Ref Range   Yeast Wet Prep HPF POC NONE SEEN NONE SEEN   Trich, Wet Prep NONE SEEN NONE SEEN   Clue Cells Wet Prep HPF POC PRESENT (A) NONE SEEN   WBC, Wet Prep HPF POC FEW (A) NONE SEEN   Sperm NONE SEEN   Pregnancy, urine     Status: None   Collection Time: 10/26/20  5:44 PM  Result Value Ref Range   Preg Test, Ur NEGATIVE NEGATIVE  Urinalysis, Complete w Microscopic Urine, Clean Catch     Status: Abnormal   Collection Time: 10/26/20  5:44 PM  Result Value Ref Range   Color, Urine YELLOW YELLOW   APPearance HAZY (A) CLEAR   Specific Gravity, Urine 1.020 1.005 - 1.030   pH 6.0 5.0 - 8.0   Glucose, UA NEGATIVE NEGATIVE mg/dL   Hgb urine dipstick TRACE (A) NEGATIVE   Bilirubin Urine SMALL (A) NEGATIVE   Ketones, ur TRACE (A) NEGATIVE mg/dL   Protein, ur NEGATIVE NEGATIVE mg/dL   Nitrite NEGATIVE NEGATIVE   Leukocytes,Ua TRACE (A) NEGATIVE    Squamous Epithelial / LPF 11-20 0 - 5   WBC, UA 11-20 0 - 5 WBC/hpf   RBC / HPF 0-5 0 - 5 RBC/hpf   Bacteria, UA FEW (A) NONE SEEN   No results found.  ED Clinical Impression  1. Bartholin's gland abscess   2. BV (bacterial vaginosis)   3. Screening for STD (sexually transmitted disease)  ED Assessment/Plan  She has a spontaneously draining Bartholin's gland abscess.  Do not think that she needs an I&D.  Checking urine pregnancy. Checking wet prep, gonorrhea, chlamydia.  Patient declined HIV, syphilis testing.  Sending wound cultures the express purulent drainage off per up-to-date recommendations.   UA noted.  Suspect is from BV/abscess.  Is a contaminated sample she has no urinary complaints, will not send off for culture, will not treat as UTI.  Patient is not pregnant.  However she has BV.  Will send home with doxycycline 100 mg p.o. twice daily for 7 days and Flagyl 500 mg p.o. 3 times daily for 7 days per up-to-date recommendations.   Doxycycline will also cover chlamydial infection.  Patient states that she can return here and bring her partners for treatment if gonorrhea comes back positive.  Tylenol/ibuprofen, nation 3-4 times a day as needed for pain.  No narcotics as patient has history of opiate abuse and is currently on methadone.  Advised frequent sitz baths/warm compresses, do not wipe, rinse with warm water after using the restroom.  Sending home with some chlorhexidine as well.  Follow-up with PMD or OB/GYN as needed.  To the ER if she gets worse for systemic symptoms.  Discussed labs, medical decision-making, treatment plan and plan for follow-up with patient.  She agrees with plan  Meds ordered this encounter  Medications  . doxycycline (VIBRAMYCIN) 100 MG capsule    Sig: Take 1 capsule (100 mg total) by mouth 2 (two) times daily for 7 days.    Dispense:  14 capsule    Refill:  0  . ibuprofen (ADVIL) 600 MG tablet    Sig: Take 1 tablet (600 mg total) by  mouth every 6 (six) hours as needed.    Dispense:  30 tablet    Refill:  0  . chlorhexidine (HIBICLENS) 4 % external liquid    Sig: Apply topically daily as needed. Dilute 10-15 mL in water, Use daily when bathing for 1-2 weeks    Dispense:  120 mL    Refill:  0  . metroNIDAZOLE (FLAGYL) 500 MG tablet    Sig: Take 1 tablet (500 mg total) by mouth 3 (three) times daily for 7 days.    Dispense:  21 tablet    Refill:  0    *This clinic note was created using [employer]. Therefore, there may be occasional mistakes despite careful proofreading.  ?     Melynda Ripple, MD 10/26/20 [phone]

## 2020-10-26 NOTE — Discharge Instructions (Addendum)
Finish the doxycycline and Flagyl even if you feel better.  Do not drink while taking the Flagyl.  600 mg of ibuprofen combined with 1000 mg of Tylenol 3-4 times a day as needed.  Sitz bath/warm compresses as often as you can.  You can use the Hibiclens and the sitz baths.  Rinse with warm water rather than wiping after using the toilet.  Follow-up with your primary care physician or your gynecologist if not getting better within a week.  We will contact you if any of your labs come back, normal and need further treatment.  Refrain from intercourse until your symptoms have resolved, you finish all of your medications and you know what your labs are.

## 2020-10-31 LAB — AEROBIC CULTURE W GRAM STAIN (SUPERFICIAL SPECIMEN): Culture: NORMAL

## 2021-01-28 ENCOUNTER — Ambulatory Visit
Admission: EM | Admit: 2021-01-28 | Discharge: 2021-01-28 | Disposition: A | Discharge status: Home / Self Care | Payer: Medicaid Other | Attending: Family Medicine | Admitting: Family Medicine

## 2021-01-28 ENCOUNTER — Other Ambulatory Visit: Payer: Self-pay

## 2021-01-28 DIAGNOSIS — A5901 Trichomonal vulvovaginitis: Secondary | ICD-10-CM | POA: Diagnosis not present

## 2021-01-28 DIAGNOSIS — N760 Acute vaginitis: Secondary | ICD-10-CM | POA: Diagnosis present

## 2021-01-28 DIAGNOSIS — B9689 Other specified bacterial agents as the cause of diseases classified elsewhere: Secondary | ICD-10-CM

## 2021-01-28 DIAGNOSIS — B370 Candidal stomatitis: Secondary | ICD-10-CM | POA: Diagnosis not present

## 2021-01-28 DIAGNOSIS — N390 Urinary tract infection, site not specified: Secondary | ICD-10-CM

## 2021-01-28 LAB — URINALYSIS, COMPLETE (UACMP) WITH MICROSCOPIC
Bilirubin Urine: NEGATIVE
Glucose, UA: NEGATIVE mg/dL
Ketones, ur: NEGATIVE mg/dL
Nitrite: NEGATIVE
Protein, ur: 100 mg/dL — AB
Specific Gravity, Urine: 1.02 (ref 1.005–1.030)
pH: 7 (ref 5.0–8.0)

## 2021-01-28 LAB — WET PREP, GENITAL
Sperm: NONE SEEN
Yeast Wet Prep HPF POC: NONE SEEN

## 2021-01-28 LAB — CHLAMYDIA/NGC RT PCR (ARMC ONLY)
Chlamydia Tr: NOT DETECTED
N gonorrhoeae: NOT DETECTED

## 2021-01-28 MED ORDER — FLUCONAZOLE 200 MG PO TABS: 200.0000 mg | ORAL_TABLET | Freq: Every day | ORAL | 1 refills | Status: DC

## 2021-01-28 MED ORDER — NYSTATIN 100000 UNIT/ML MT SUSP: 500000.0000 [IU] | Freq: Four times a day (QID) | OROMUCOSAL | 0 refills | Status: DC

## 2021-01-28 MED ORDER — CEPHALEXIN 500 MG PO CAPS: 500.0000 mg | ORAL_CAPSULE | Freq: Two times a day (BID) | ORAL | 0 refills | Status: AC

## 2021-01-28 MED ORDER — METRONIDAZOLE 500 MG PO TABS: 500.0000 mg | ORAL_TABLET | Freq: Two times a day (BID) | ORAL | 0 refills | Status: DC

## 2021-01-28 NOTE — ED Triage Notes (Signed)
Pt c/o vaginal itching and discharge since Wednesday. Pt did use a one day antifungal but has not seen much improvement. Pt also reports white spots in her mouth and throat with some pain. Pt also reports her mouth feels dry. Pt also reports recent unprotected sexual encounter and would like STD testing.

## 2021-01-28 NOTE — Discharge Instructions (Addendum)
Take the Flagyl twice daily for 7 days for treatment of your trichomoniasis and your bacterial vaginosis.  Take the Keflex twice daily for 5 days for treatment of urinary tract infection.  Take the nystatin, 5 mL 4 times a day swish and swallow for treatment of your possible oral thrush.  We will call you when your STI testing comes back and treat you accordingly.  Return for any new or worsening symptoms.

## 2021-01-28 NOTE — ED Provider Notes (Signed)
[facility]    CSN: [id number] Arrival date & time: 01/28/21  1419      History   Chief Complaint Chief Complaint  Patient presents with  . Vaginal Discharge  . Sore Throat    HPI Faith Hill is a 40 y.o. female.   HPI   40 year old female here for evaluation of multiple complaints.  Patient reports that she has been experiencing some vaginal discharge and itching for the last 4 days.  She states that she tried a 1 day over-the-counter Monistat treatment without relief.  She describes her discharge as being green and states that her vaginal area is red when she looked with a mirror.  Patient did have recent unprotected vaginal and oral sex with a new partner.  Patient reports that her discharge has an odor but it does not smell fishy.  Additionally, patient developed a sore throat today as well as a sore tongue is complaining of white spots on top and under her tongue.  As of today she also developed some lower abdominal/suprapubic pain.  Patient denies back pain, fever, painful urination, recent antibiotic usage, nausea, or vomiting.  Past Medical History:  Diagnosis Date  . H/O opioid abuse ([place])    sober since 2009  . Migraine without aura     Patient Active Problem List   Diagnosis Date Noted  . Mitral regurgitation 01/31/2020  . Chronic migraine without aura, with intractable migraine, so stated, with status migrainosus 12/14/2018  . Migraine without aura and without status migrainosus, not intractable 08/12/2018  . Medication overuse headache 08/12/2018  . Hyperlipidemia 06/02/2018  . 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 Varicose veins of both lower extremities 03/20/2017  . Low back pain 08/30/2016  . Other headache syndrome 08/07/2016  . Constipation 09/26/2015  . Opioid dependence in remission ([place]) 11/29/2014  . Allergic rhinitis 06/24/2014  . Anxiety state 08/26/2012    Past Surgical History:  Procedure Laterality Date  . WRIST  SURGERY Bilateral    pins     OB History   No obstetric history on file.      Home Medications    Prior to Admission medications   Medication Sig Start Date End Date Taking? Authorizing Provider  cephALEXin (KEFLEX) 500 MG capsule Take 1 capsule (500 mg total) by mouth 2 (two) times daily for 5 days. 01/28/21 02/02/21 Yes Margarette Canada, NP  furosemide (LASIX) 20 MG tablet Take 40 mg by mouth daily.    Yes [provider]  METHADONE HCL PO Take 60 mg by mouth daily. Liquid   Yes [provider]  metroNIDAZOLE (FLAGYL) 500 MG tablet Take 1 tablet (500 mg total) by mouth 2 (two) times daily. 01/28/21  Yes Margarette Canada, NP  MULTIPLE VITAMIN PO Take by mouth.   Yes [provider]  nystatin (MYCOSTATIN) 100000 UNIT/ML suspension Take 5 mLs (500,000 Units total) by mouth 4 (four) times daily. 01/28/21  Yes Margarette Canada, NP  spironolactone (ALDACTONE) 25 MG tablet Take 25 mg by mouth daily. 09/04/20  Yes [provider]  glycopyrrolate (ROBINUL) 1 MG tablet Glycopyrrolate 1 MG Oral Tablet QTY: 30 tablet Days: 30 Refills: 1  Written: 10/23/18 Patient Instructions: Take 1 tablet by mouth daily in AM 10/23/18 10/26/20  [provider]    Family History Family History  Problem Relation Age of Onset  . Heart disease Other   . Heart disease Mother   . Breast cancer Sister 55    Social History Social History  Tobacco Use  . Smoking status: Former Research scientist (life sciences)  . Smokeless tobacco: Never Used  . Tobacco comment: 1.25-1.5 ppd  Vaping Use  . Vaping Use: Every day  . Start date: 11/04/2010  Substance Use Topics  . Alcohol use: Not Currently  . Drug use: Not Currently    Comment: quit 2009; h/o opioid abuse     Allergies   Sulfa antibiotics   Review of Systems Review of Systems  Constitutional: Negative for activity change, appetite change and fever.  HENT: Positive for mouth sores and sore throat.   Gastrointestinal: Positive for abdominal pain.  Negative for nausea and vomiting.  Genitourinary: Positive for vaginal discharge and vaginal pain. Negative for dysuria, frequency, hematuria, urgency and vaginal bleeding.  Musculoskeletal: Negative for back pain.  Skin: Negative for rash.  Hematological: Negative.   Psychiatric/Behavioral: Negative.      Physical Exam Triage Vital Signs ED Triage Vitals  Enc Vitals Group     BP 01/28/21 1428 (!) 158/95     Pulse Rate 01/28/21 1428 97     Resp 01/28/21 1428 18     Temp 01/28/21 1428 98.2 F (36.8 C)     Temp Source 01/28/21 1428 Oral     SpO2 01/28/21 1428 100 %     Weight 01/28/21 1426 193 lb (87.5 kg)     Height 01/28/21 1426 5\' 11"  (1.803 m)     Head Circumference --      Peak Flow --      Pain Score 01/28/21 1425 7     Pain Loc --      Pain Edu? --      Excl. in [place]? --    No data found.  Updated Vital Signs BP (!) 158/95 (BP Location: Left Arm)   Pulse 97   Temp 98.2 F (36.8 C) (Oral)   Resp 18   Ht 5\' 11"  (1.803 m)   Wt 193 lb (87.5 kg)   LMP 01/25/2021 (Approximate)   SpO2 100%   BMI 26.92 kg/m   Visual Acuity Right Eye Distance:   Left Eye Distance:   Bilateral Distance:    Right Eye Near:   Left Eye Near:    Bilateral Near:     Physical Exam Vitals and nursing note reviewed.  Constitutional:      General: She is not in acute distress.    Appearance: She is well-developed. She is not ill-appearing.  HENT:     Head: Normocephalic and atraumatic.     Mouth/Throat:     Mouth: Mucous membranes are moist. Oral lesions present.     Pharynx: Oropharynx is clear. No pharyngeal swelling or posterior oropharyngeal erythema.     Tonsils: No tonsillar exudate. 0 on the right. 0 on the left.  Cardiovascular:     Rate and Rhythm: Normal rate and regular rhythm.     Heart sounds: Normal heart sounds. No murmur heard. No gallop.   Pulmonary:     Effort: Pulmonary effort is normal.     Breath sounds: Normal breath sounds. No wheezing, rhonchi or rales.   Abdominal:     General: Bowel sounds are normal.     Palpations: Abdomen is soft.     Tenderness: There is abdominal tenderness. There is no guarding or rebound.  Skin:    General: Skin is warm and dry.     Capillary Refill: Capillary refill takes less than 2 seconds.     Findings: No erythema or rash.  Neurological:  General: No focal deficit present.     Mental Status: She is alert and oriented to person, place, and time.  Psychiatric:        Mood and Affect: Mood normal.        Behavior: Behavior normal.      UC Treatments / Results  Labs (all labs ordered are listed, but only abnormal results are displayed) Labs Reviewed  WET PREP, GENITAL - Abnormal; Notable for the following components:      Result Value   Trich, Wet Prep PRESENT (*)    Clue Cells Wet Prep HPF POC PRESENT (*)    WBC, Wet Prep HPF POC FEW (*)    All other components within normal limits  URINALYSIS, COMPLETE (UACMP) WITH MICROSCOPIC - Abnormal; Notable for the following components:   APPearance HAZY (*)    Hgb urine dipstick TRACE (*)    Protein, ur 100 (*)    Leukocytes,Ua MODERATE (*)    Bacteria, UA FEW (*)    Trichomonas, UA PRESENT (*)    All other components within normal limits  CHLAMYDIA/NGC RT PCR ([facility] ONLY)  URINE CULTURE    EKG   Radiology No results found.  Procedures Procedures (including critical care time)  Medications Ordered in UC Medications - No data to display  Initial Impression / Assessment and Plan / UC Course  I have reviewed the triage vital signs and the nursing notes.  Pertinent labs & imaging results that were available during my care of the patient were reviewed by me and considered in my medical decision making (see chart for details).   Patient is a very pleasant 40 year old female here for evaluation of multiple complaints.  Her primary complaint is vaginal itching and a green discharge that started 4 days ago.  Patient reports she did have  recent unprotected sex with a new partner at that time and is concerned she might have an STI and would like to be tested.  Patient did use an over-the-counter 1 day Monistat yeast infection treatment but it did not improve her symptoms.  The day after she started her symptoms she did begin her cycle but that is now complete.  Patient is also concerned because today she developed a sore throat and she noticed white spots on the top of and underneath her tongue.  She had unprotected oral sex with the same partner and is again concerned about STIs.  Physical exam reveals a pink and moist posterior oropharynx without tonsillar edema, erythema, or exudate.  There is a white spot to the left on the top of the tongue and 2 spots to the left on the underside of the tongue.  Patient's lungs are clear to auscultation all fields.  Patient abdomen is soft, nondistended, with positive bowel sounds in all 4 quadrants.  Patient does have suprapubic tenderness to palpation but no tenderness in any other quadrant.  UA, gonorrhea chlamydia, and wet prep collected at triage.  Wet prep shows the presence of both trichomoniasis and clue cells.  No yeast is present.  Urinalysis reveals trace hemoglobin, 100 protein, moderate leukocytes, 11-20 WBCs, few bacteria, and trichomonas.  We will send urine for culture.  We will treat for trichomonas, bacterial vaginosis, and UTI. Will cover patient with Flagyl twice daily x7 days for the BV and trichomonas and Keflex twice daily x5 days for the UTI.  Oral lesions are of undetermined etiology.  Will prescribe Diflucan to cover for possible thrush.   Final Clinical Impressions(s) /  UC Diagnoses   Final diagnoses:  BV (bacterial vaginosis)  Trichomoniasis of vagina  Lower urinary tract infectious disease  Oral thrush     Discharge Instructions     Take the Flagyl twice daily for 7 days for treatment of your trichomoniasis and your bacterial vaginosis.  Take the Keflex twice  daily for 5 days for treatment of urinary tract infection.  Take the nystatin, 5 mL 4 times a day swish and swallow for treatment of your possible oral thrush.  We will call you when your STI testing comes back and treat you accordingly.  Return for any new or worsening symptoms.    ED Prescriptions    Medication Sig Dispense Auth. Provider   metroNIDAZOLE (FLAGYL) 500 MG tablet Take 1 tablet (500 mg total) by mouth 2 (two) times daily. 14 tablet Margarette Canada, NP   fluconazole (DIFLUCAN) 200 MG tablet  (Status: Discontinued) Take 1 tablet (200 mg total) by mouth daily for 2 doses. 2 tablet Margarette Canada, NP   cephALEXin (KEFLEX) 500 MG capsule Take 1 capsule (500 mg total) by mouth 2 (two) times daily for 5 days. 10 capsule Margarette Canada, NP   nystatin (MYCOSTATIN) 100000 UNIT/ML suspension Take 5 mLs (500,000 Units total) by mouth 4 (four) times daily. 60 mL Margarette Canada, NP     PDMP not reviewed this encounter.   Margarette Canada, NP 01/28/21 [phone]

## 2021-01-29 LAB — CT/GC NAA, PHARYNGEAL
C TRACH RRNA NPH QL PCR: NEGATIVE
N GONORRHOEA RRNA NPH QL PCR: NEGATIVE

## 2021-01-30 LAB — URINE CULTURE
Culture: <10000 — AB
Special Requests: NORMAL

## 2021-02-21 ENCOUNTER — Encounter: Payer: Medicaid Other | Admitting: Obstetrics and Gynecology

## 2021-02-21 DIAGNOSIS — Z114 Encounter for screening for human immunodeficiency virus [HIV]: Secondary | ICD-10-CM

## 2021-02-21 DIAGNOSIS — Z1159 Encounter for screening for other viral diseases: Secondary | ICD-10-CM

## 2021-02-21 DIAGNOSIS — Z124 Encounter for screening for malignant neoplasm of cervix: Secondary | ICD-10-CM

## 2021-02-21 DIAGNOSIS — Z23 Encounter for immunization: Secondary | ICD-10-CM

## 2021-03-09 ENCOUNTER — Encounter: Payer: Self-pay | Admitting: Obstetrics and Gynecology

## 2021-11-26 IMAGING — MG DIGITAL SCREENING BILAT W/ TOMO W/ CAD
8 series · 9 of 24 positions shown · non-contrast
Comparison: None.

CLINICAL DATA: Screening.

EXAM:
DIGITAL SCREENING BILATERAL MAMMOGRAM WITH TOMO AND CAD

[L CC synth-2D]
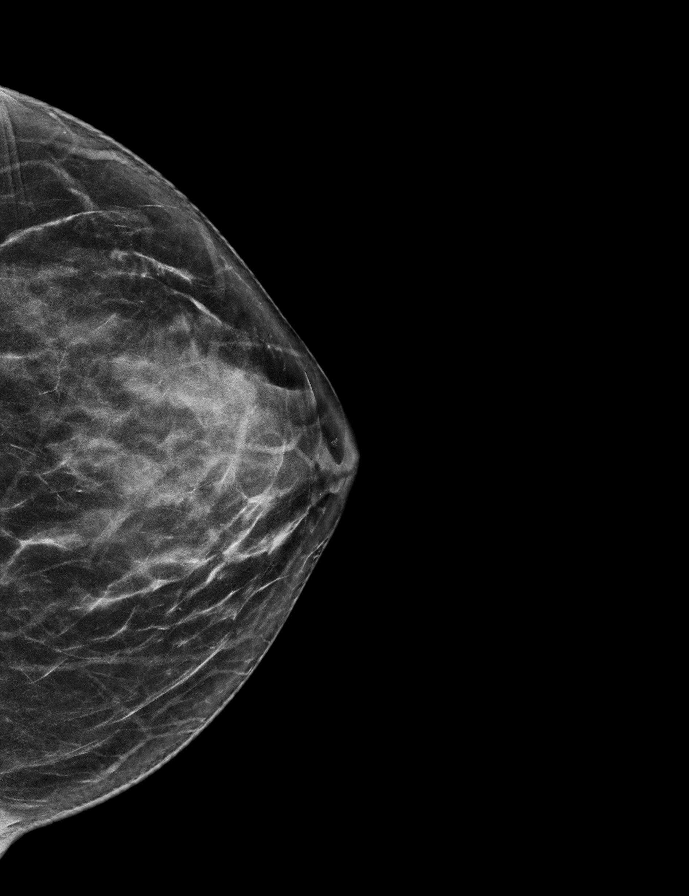

[R MLO synth-2D]
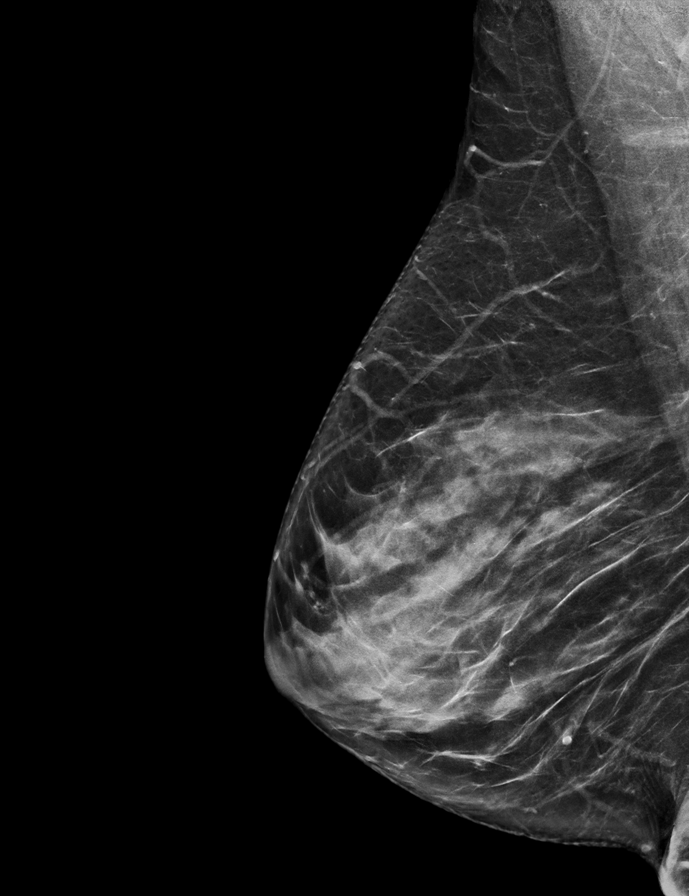

[L MLO synth-2D]
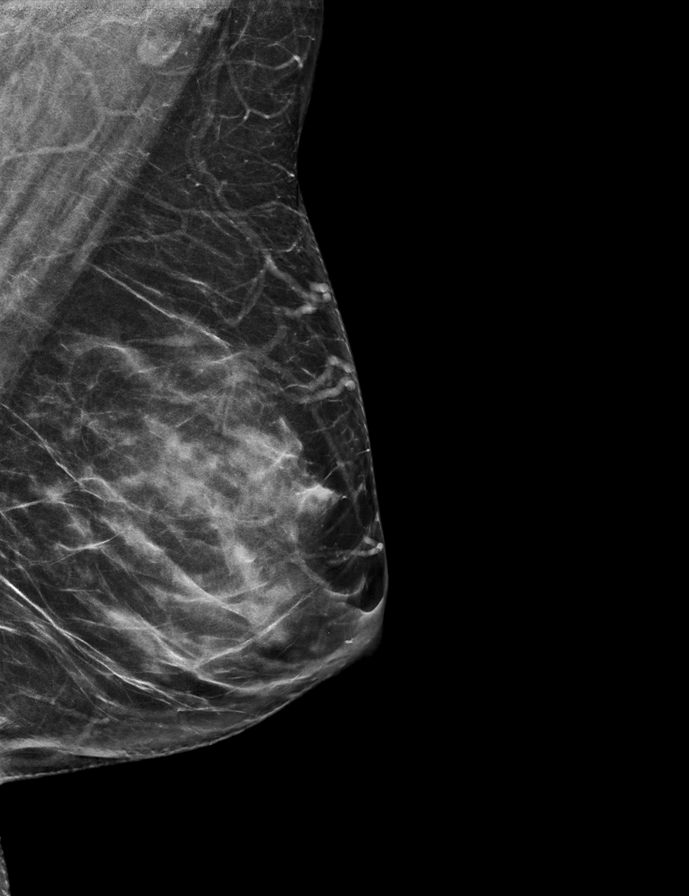

[R CC synth-2D]
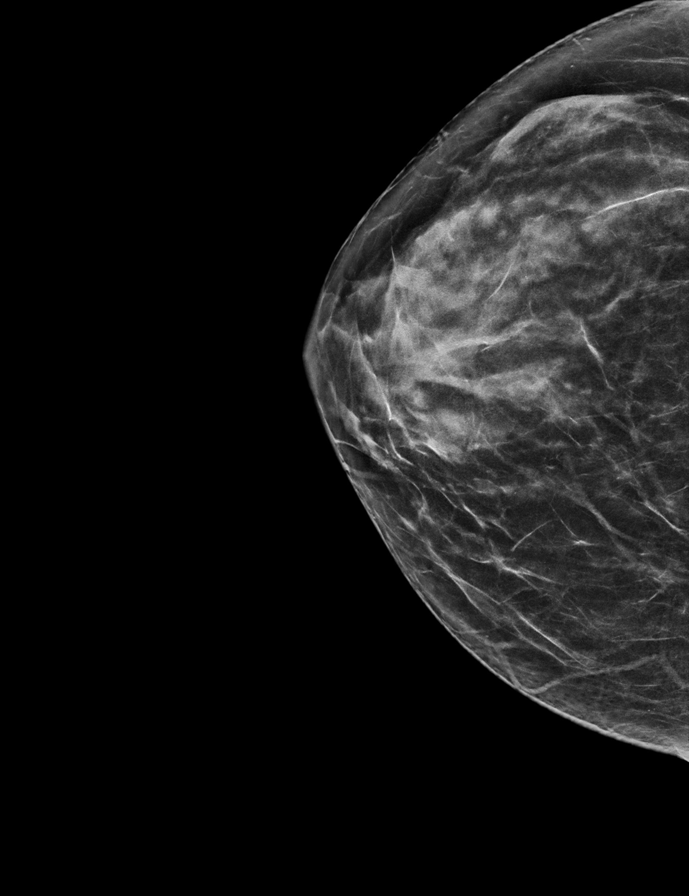

[L MLO tomo · 2 of 70 frames shown]
[frame 23/70]
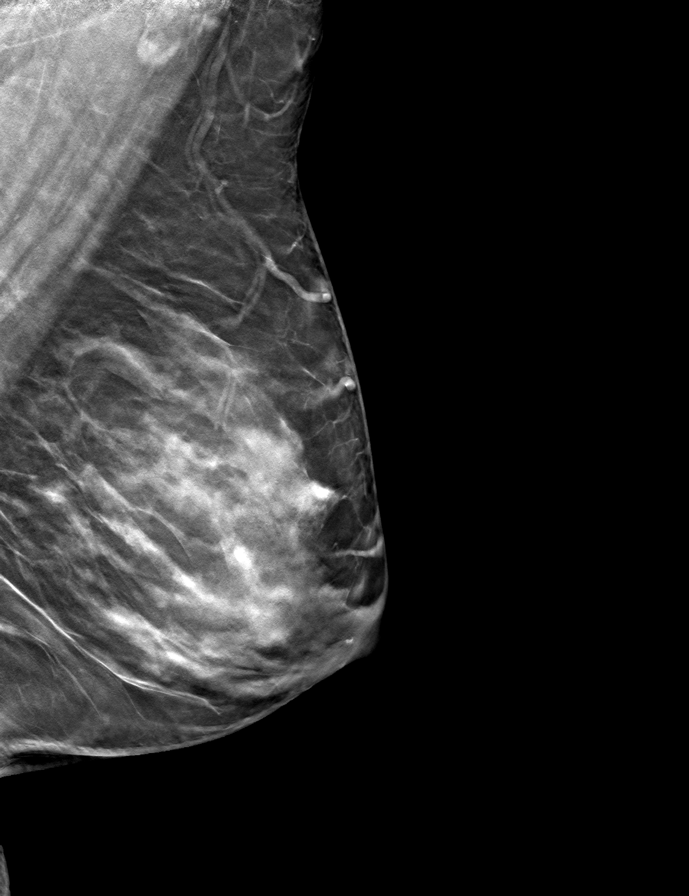
[frame 35/70]
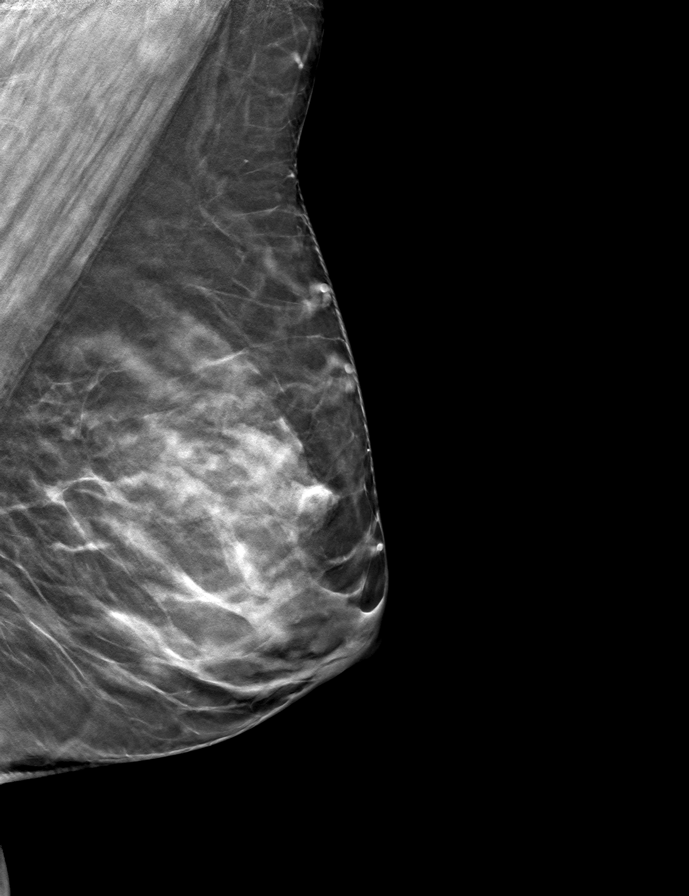

[R CC tomo · tomo slice 30/59.0]
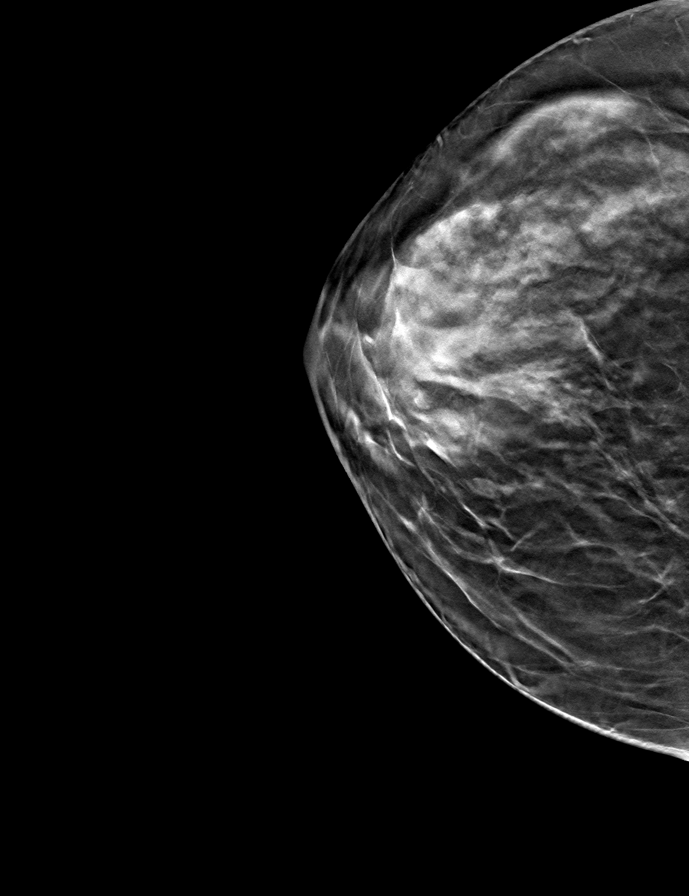

[L CC tomo · tomo slice 32/63.0]
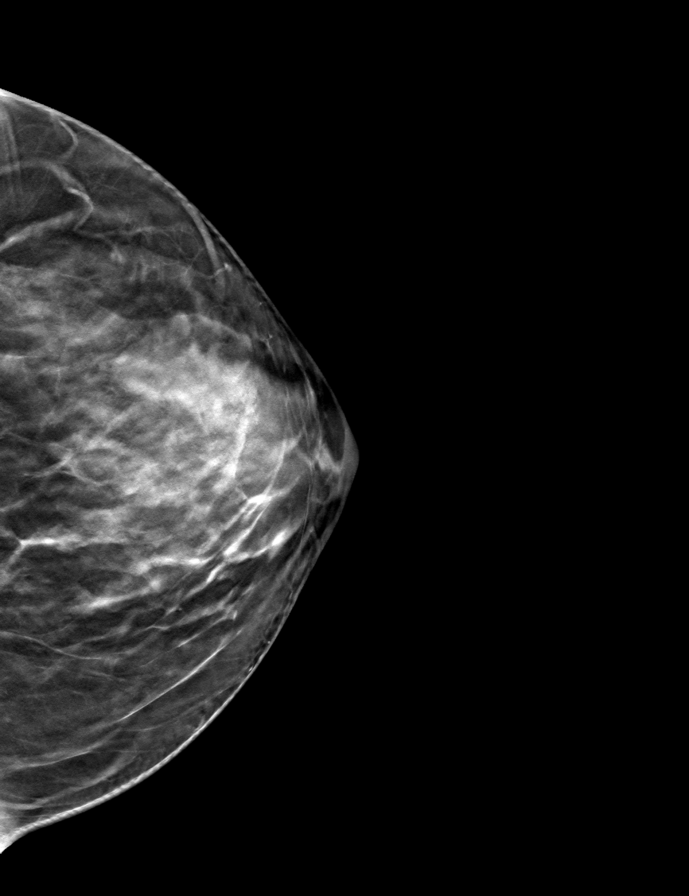

[R MLO tomo · tomo slice 35/70.0]
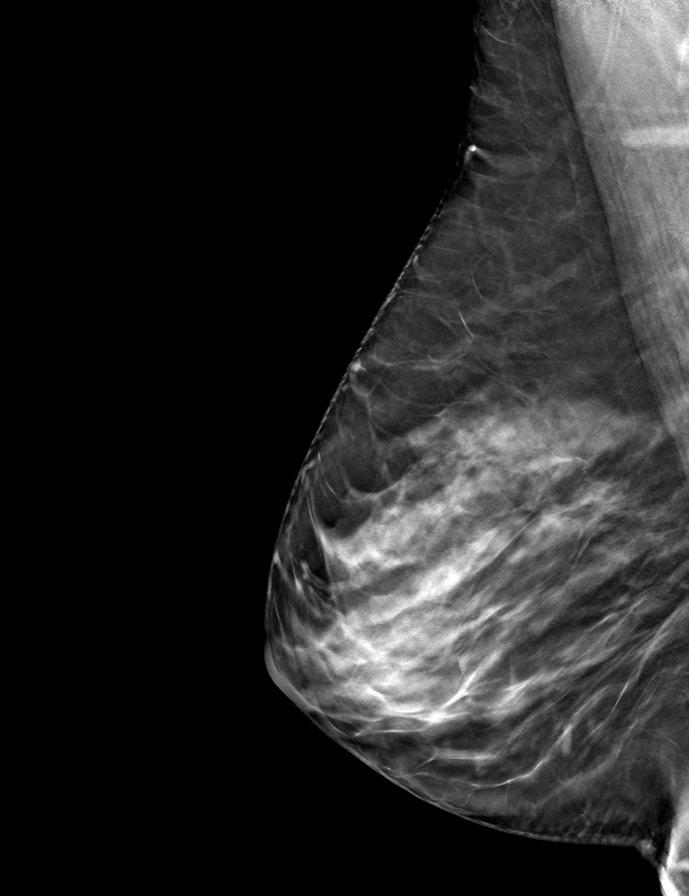

[9 of 24 positions shown; findings below may reference images not displayed]

ACR Breast Density Category c: The breast tissue is heterogeneously
dense, which may obscure small masses
FINDINGS: There are no findings suspicious for malignancy. Images were
processed with CAD.
IMPRESSION: No mammographic evidence of malignancy. A result letter of this
screening mammogram will be mailed directly to the patient.

RECOMMENDATION:
Screening mammogram in one year. (Code:EM-2-IHY)

BI-RADS CATEGORY  1: Negative.

## 2021-12-28 ENCOUNTER — Encounter: Payer: Medicaid Other | Admitting: Obstetrics

## 2022-04-02 ENCOUNTER — Ambulatory Visit (LOCAL_COMMUNITY_HEALTH_CENTER): Payer: Medicaid Other | Admitting: Family Medicine

## 2022-04-02 ENCOUNTER — Encounter: Payer: Self-pay | Admitting: Family Medicine

## 2022-04-02 VITALS — BP 115/83 | Ht 70.5 in | Wt 211.4 lb

## 2022-04-02 DIAGNOSIS — Z3202 Encounter for pregnancy test, result negative: Secondary | ICD-10-CM

## 2022-04-02 DIAGNOSIS — Z30011 Encounter for initial prescription of contraceptive pills: Secondary | ICD-10-CM | POA: Diagnosis not present

## 2022-04-02 DIAGNOSIS — Z3009 Encounter for other general counseling and advice on contraception: Secondary | ICD-10-CM

## 2022-04-02 DIAGNOSIS — Z3200 Encounter for pregnancy test, result unknown: Secondary | ICD-10-CM

## 2022-04-02 LAB — PREGNANCY, URINE: Preg Test, Ur: NEGATIVE

## 2022-04-02 MED ORDER — NORETHINDRONE 0.35 MG PO TABS: 1.0000 | ORAL_TABLET | Freq: Every day | ORAL | 2 refills | Status: DC

## 2022-04-02 NOTE — Progress Notes (Signed)
[facility] Main Number: [phone]    Family Planning Visit- Initial Visit  Subjective:  Faith Hill is a 41 y.o.  [phone]   being seen today for an initial annual visit and to discuss reproductive life planning.  The patient is currently using No Method - Other Reason for pregnancy prevention. Patient reports   does not want a pregnancy in the next year.     report they are looking for a method that provides High efficacy at preventing pregnancy  Patient has the following medical conditions has Migraine without aura and without status migrainosus, not intractable; Medication overuse headache; Chronic migraine without aura, with intractable migraine, so stated, with status migrainosus; Allergic rhinitis; Anxiety state; Constipation; Hyperlipidemia; Low back pain; Opioid dependence in remission ([place]); Other headache syndrome; Primary hypersomnia; Mitral regurgitation; and Varicose veins of both lower extremities on their problem list.  Chief Complaint  Patient presents with   Annual Exam    PE and Nexplanon Insertion    Patient reports she is here today birth control.  She would like to have the Nexplanon as her birth control.  She has used Nexplanon in the past. Sometimes use condoms for birth control   Client has a h/o of irregular menses, LMP was 02/17/2022.  She receives PC  at [facility]  states she has a h/o of drug dependence and has been clean for over 13 years.                                        Patient denies concerns  Body mass index is 29.9 kg/m. - Patient is eligible for diabetes screening based on BMI and age >65?  Yes  HA1C ordered? Declines  Client has had testing at her [facility] provider  Patient reports 1  partner/s in last year. Desires STI screening?  No-declines  Has patient been screened once for HCV in the past?  Yes  PCP has done testing.  No results found  for: HCVAB  Does the patient have current drug use (including MJ), have a partner with drug use, and/or has been incarcerated since last result? No  If yes-- Screen for HCV through [facility]   Does the patient meet criteria for HBV testing? No  Criteria:  -Household, sexual or needle sharing contact with HBV -History of drug use -HIV positive -Those with known Hep C   Health Maintenance Due  Topic Date Due   COVID-19 Vaccine (1) Never done   HIV Screening  Never done   Hepatitis C Screening  Never done   PAP SMEAR-Modifier  12/10/2015   TETANUS/TDAP  08/01/2019    Review of Systems  Constitutional:  Positive for weight loss (feels that she has lost weight since started new fluid pill).  Gastrointestinal:  Positive for nausea (few days have been feeling nausea-no vomiting).  All other systems reviewed and are negative.  The following portions of the patient's history were reviewed and updated as appropriate: allergies, current medications, past family history, past medical history, past social history, past surgical history and problem list. Problem list updated.   See flowsheet for other program required questions.  Objective:   Vitals:   04/02/22 1547 04/02/22 1602  BP: 132/90 115/83  Weight: 211 lb 6.4 oz (95.9 kg)   Height: 5' 10.5" (1.791 m)  Physical Exam Constitutional:      General: She is not in acute distress.    Appearance: Normal appearance. She is obese.  HENT:     Head: Normocephalic and atraumatic.  Cardiovascular:     Rate and Rhythm: Normal rate.  Pulmonary:     Effort: Pulmonary effort is normal.     Breath sounds: Normal breath sounds.  Abdominal:     Palpations: Abdomen is soft.  Genitourinary:    Comments: Client declines pelvic exam today.   Musculoskeletal:        General: Normal range of motion.     Cervical back: Neck supple.  Skin:    General: Skin is warm and dry.  Neurological:     General: No focal deficit present.      Mental Status: She is alert.  Psychiatric:        Mood and Affect: Mood normal.        Behavior: Behavior normal.      Assessment and Plan:  Faith Hill is a 41 y.o. female presenting to the [facility] Department for an initial annual wellness/contraceptive visit  Contraception counseling: Reviewed options based on patient desire and reproductive life plan. Patient is interested in Hormonal Implant. This was not provided to the patient today.  Client has had numerous unprotected  sexual activity with partner.  Discussed bridging her with POPs, PT in 2 weeks and if negative will insert Nexplanon.  Client agrees to this plan.  Risks, benefits, and typical effectiveness rates were reviewed.  Questions were answered.  Written information was also given to the patient to review.    The patient will follow up in  2 weeks for surveillance.  The patient was told to call with any further questions, or with any concerns about this method of contraception.  Emphasized use of condoms 100% of the time for STI prevention.  Need for ECP was assessed. Patient reported > 120 hours .  Reviewed options and patient desired No method of ECP, declined all    1. Pregnancy examination or test, pregnancy unconfirmed  - Pregnancy, urine-negative  2. Family planning Last Pap 01/31/2020 Start Micronor today to bridge until she RTC for PT and Nexplanon if PT is negative. Co not to have unprotected sex until PT in 2 weeks.  Client states she understands the instructions.  Agrees that she and partner will use condoms.  3. OCP (oral contraceptive pills) initiation  - norethindrone (MICRONOR) 0.35 MG tablet; Take 1 tablet (0.35 mg total) by mouth daily.  Dispense: 28 tablet; Refill: 2     Return in about 2 weeks (around 04/16/2022) for PT and Nexplanon insert if PT negative..  Future Appointments  Date Time Provider [place]  05/28/2022  3:30 PM Brendolyn Patty, MD [facility] None     Hassell Done, FNP

## 2022-04-02 NOTE — Progress Notes (Signed)
Pt does not desire STD testing. UPT negative in clinic today. Consent signed for Progestin BCP and consent signed for Nexplanon. Oral BCP prescription was sent to her pharmacy. Verbalizes understanding of need for appointment in 2 weeks for Nexplanon insertion. Keitha Butte RN

## 2022-04-11 ENCOUNTER — Encounter (INDEPENDENT_AMBULATORY_CARE_PROVIDER_SITE_OTHER): Payer: Self-pay | Admitting: Nurse Practitioner

## 2022-04-11 ENCOUNTER — Ambulatory Visit (INDEPENDENT_AMBULATORY_CARE_PROVIDER_SITE_OTHER): Payer: Medicaid Other | Admitting: Nurse Practitioner

## 2022-04-11 VITALS — BP 117/75 | HR 72 | Resp 17 | Ht 71.0 in | Wt 213.0 lb

## 2022-04-11 DIAGNOSIS — I83813 Varicose veins of bilateral lower extremities with pain: Secondary | ICD-10-CM

## 2022-04-11 DIAGNOSIS — E785 Hyperlipidemia, unspecified: Secondary | ICD-10-CM

## 2022-04-27 ENCOUNTER — Encounter (INDEPENDENT_AMBULATORY_CARE_PROVIDER_SITE_OTHER): Payer: Self-pay | Admitting: Nurse Practitioner

## 2022-05-01 ENCOUNTER — Ambulatory Visit: Payer: Medicaid Other

## 2022-05-16 ENCOUNTER — Other Ambulatory Visit (INDEPENDENT_AMBULATORY_CARE_PROVIDER_SITE_OTHER): Payer: Self-pay | Admitting: Nurse Practitioner

## 2022-05-16 DIAGNOSIS — I83813 Varicose veins of bilateral lower extremities with pain: Secondary | ICD-10-CM

## 2022-05-17 ENCOUNTER — Encounter (INDEPENDENT_AMBULATORY_CARE_PROVIDER_SITE_OTHER): Payer: Medicaid Other

## 2022-05-17 ENCOUNTER — Ambulatory Visit (INDEPENDENT_AMBULATORY_CARE_PROVIDER_SITE_OTHER): Payer: Medicaid Other | Admitting: Nurse Practitioner

## 2022-05-28 ENCOUNTER — Ambulatory Visit (INDEPENDENT_AMBULATORY_CARE_PROVIDER_SITE_OTHER): Payer: Medicaid Other | Admitting: Dermatology

## 2022-05-28 ENCOUNTER — Encounter: Payer: Self-pay | Admitting: Dermatology

## 2022-05-28 DIAGNOSIS — L814 Other melanin hyperpigmentation: Secondary | ICD-10-CM

## 2022-05-28 DIAGNOSIS — D0462 Carcinoma in situ of skin of left upper limb, including shoulder: Secondary | ICD-10-CM

## 2022-05-28 DIAGNOSIS — C4492 Squamous cell carcinoma of skin, unspecified: Secondary | ICD-10-CM

## 2022-05-28 DIAGNOSIS — L820 Inflamed seborrheic keratosis: Secondary | ICD-10-CM | POA: Diagnosis not present

## 2022-05-28 DIAGNOSIS — L578 Other skin changes due to chronic exposure to nonionizing radiation: Secondary | ICD-10-CM

## 2022-05-28 DIAGNOSIS — D2372 Other benign neoplasm of skin of left lower limb, including hip: Secondary | ICD-10-CM | POA: Diagnosis not present

## 2022-05-28 DIAGNOSIS — L988 Other specified disorders of the skin and subcutaneous tissue: Secondary | ICD-10-CM | POA: Diagnosis not present

## 2022-05-28 DIAGNOSIS — D492 Neoplasm of unspecified behavior of bone, soft tissue, and skin: Secondary | ICD-10-CM

## 2022-05-28 DIAGNOSIS — L821 Other seborrheic keratosis: Secondary | ICD-10-CM

## 2022-05-28 HISTORY — DX: Squamous cell carcinoma of skin, unspecified: C44.92

## 2022-05-28 NOTE — Progress Notes (Signed)
New Patient Visit  Subjective  Faith Hill is a 41 y.o. female who presents for the following: lesion (Red spot on left hand. PCP gave Clotrimazole cream. Helps but very little, with coloration. Spot is growing over time. Dur: 14 months. Has similar smaller areas on legs and arms) and Facial Elastosis (Texture of skin on face is bad, per patient. Would like to discuss facial care regimen).  The patient has spots, moles and lesions to be evaluated, some may be new or changing and the patient has concerns that these could be cancer.  Review of Systems: No other skin or systemic complaints except as noted in HPI or Assessment and Plan.   Objective  Well appearing patient in no apparent distress; mood and affect are within normal limits.  A focused examination was performed including arms, hands, legs, face. Relevant physical exam findings are noted in the Assessment and Plan.  left forearm x1, left thigh x2, left knee x1, right medial thigh x2, left upper calf x1 Tiny pink-brown keratotic papules  Left Hand Dorsum 1.1 cm pink slightly pearly plaque     Head - Anterior (Face) Rhytides and volume loss.    Assessment & Plan   Dermatofibroma. Left pretibia.  - Firm pink/brown papulenodule with dimple sign - Benign appearing - Call for any changes  Actinic Damage - chronic, secondary to cumulative UV radiation exposure/sun exposure over time - diffuse scaly erythematous macules with underlying dyspigmentation - Recommend daily broad spectrum sunscreen SPF 30+ to sun-exposed areas, reapply every 2 hours as needed.  - Recommend staying in the shade or wearing long sleeves, sun glasses (UVA+UVB protection) and wide brim hats (4-inch brim around the entire circumference of the hat). - Call for new or changing lesions.  Lentigines - Scattered tan macules - Due to sun exposure - Benign-appering, observe - Recommend daily broad spectrum sunscreen SPF 30+ to sun-exposed  areas, reapply every 2 hours as needed. - Call for any changes  Seborrheic Keratoses - Stuck-on, waxy, tan-brown papules and/or plaques  - Benign-appearing - Discussed benign etiology and prognosis. - Observe - Call for any changes  Inflamed seborrheic keratosis left forearm x1, left thigh x2, left knee x1, right medial thigh x2, left upper calf x1  Vs irritated flat warts  Symptomatic, irritating, patient would like treated.  Destruction of lesion - left forearm x1, left thigh x2, left knee x1, right medial thigh x2, left upper calf x1  Destruction method: cryotherapy   Informed consent: discussed and consent obtained   Lesion destroyed using liquid nitrogen: Yes   Region frozen until ice ball extended beyond lesion: Yes   Outcome: patient tolerated procedure well with no complications   Post-procedure details: wound care instructions given   Additional details:  Prior to procedure, discussed risks of blister formation, small wound, skin dyspigmentation, or rare scar following cryotherapy. Recommend Vaseline ointment to treated areas while healing.   Neoplasm of skin Left Hand Dorsum  Epidermal / dermal shaving  Lesion diameter (cm):  1.1 Informed consent: discussed and consent obtained   Patient was prepped and draped in usual sterile fashion: Area prepped with alcohol. Anesthesia: the lesion was anesthetized in a standard fashion   Anesthetic:  1% lidocaine w/ epinephrine 1-100,000 buffered w/ 8.4% NaHCO3 Instrument used: flexible razor blade   Hemostasis achieved with: pressure, aluminum chloride and electrodesiccation   Outcome: patient tolerated procedure well   Post-procedure details: wound care instructions given   Post-procedure details comment:  Ointment and small  bandage applied  Specimen 1 - Surgical pathology Differential Diagnosis: ISK, R/O BCC vs SCC  Check Margins: No  Elastosis of skin Head - Anterior (Face)  Recommend daily moisturizer with  sunscreen in AM Recommend using Vitamin C Serum in mornings. Recommend using Retinol cream at bedtime.   Deeper lines and wrinkles perioral would require fillers. Forehead lines will require Botox.  Recommend daily broad spectrum sunscreen SPF 30+ to sun-exposed areas, reapply every 2 hours as needed. Call for new or changing lesions.  Staying in the shade or wearing long sleeves, sun glasses (UVA+UVB protection) and wide brim hats (4-inch brim around the entire circumference of the hat) are also recommended for sun protection.    Return pending biopsy.  I, Lawson Radar, CMA, am acting as scribe for Willeen Niece, MD.  Documentation: I have reviewed the above documentation for accuracy and completeness, and I agree with the above.  Willeen Niece MD

## 2022-05-28 NOTE — Patient Instructions (Addendum)
Cryotherapy Aftercare  Wash gently with soap and water everyday.   Apply Vaseline and Band-Aid daily until healed.      Wound Care Instructions  Cleanse wound gently with soap and water once a day then pat dry with clean gauze. Apply a thing coat of Petrolatum (petroleum jelly, "Vaseline") over the wound (unless you have an allergy to this). We recommend that you use a new, sterile tube of Vaseline. Do not pick or remove scabs. Do not remove the yellow or white "healing tissue" from the base of the wound.  Cover the wound with fresh, clean, nonstick gauze and secure with paper tape. You may use Band-Aids in place of gauze and tape if the would is small enough, but would recommend trimming much of the tape off as there is often too much. Sometimes Band-Aids can irritate the skin.  You should call the office for your biopsy report after 1 week if you have not already been contacted.  If you experience any problems, such as abnormal amounts of bleeding, swelling, significant bruising, significant pain, or evidence of infection, please call the office immediately.  FOR ADULT SURGERY PATIENTS: If you need something for pain relief you may take 1 extra strength Tylenol (acetaminophen) AND 2 Ibuprofen ('200mg'$  each) together every 4 hours as needed for pain. (do not take these if you are allergic to them or if you have a reason you should not take them.) Typically, you may only need pain medication for 1 to 3 days.   Recommend using Vitamin C Serum in mornings. Recommend using Retinol at bedtime.   Deeper lines and wrinkles would require fillers.  Forehead lines will require Botox.  Recommend morning moisturizer with sunscreen.  Recommend daily broad spectrum sunscreen SPF 30+ to sun-exposed areas, reapply every 2 hours as needed. Call for new or changing lesions.  Staying in the shade or wearing long sleeves, sun glasses (UVA+UVB protection) and wide brim hats (4-inch brim around the entire  circumference of the hat) are also recommended for sun protection.    Due to recent changes in healthcare laws, you may see results of your pathology and/or laboratory studies on MyChart before the doctors have had a chance to review them. We understand that in some cases there may be results that are confusing or concerning to you. Please understand that not all results are received at the same time and often the doctors may need to interpret multiple results in order to provide you with the best plan of care or course of treatment. Therefore, we ask that you please give [place] 2 business days to thoroughly review all your results before contacting the office for clarification. Should we see a critical lab result, you will be contacted sooner.   If You Need Anything After Your Visit  If you have any questions or concerns for your doctor, please call our main line at [phone] and press option 4 to reach your doctor's medical assistant. If no one answers, please leave a voicemail as directed and we will return your call as soon as possible. Messages left after 4 pm will be answered the following business day.   You may also send [place] a message via [place]. We typically respond to MyChart messages within 1-2 business days.  For prescription refills, please ask your pharmacy to contact our office. Our fax number is [phone].  If you have an urgent issue when the clinic is closed that cannot wait until the next business day, you can page  your doctor at the number below.    Please note that while we do our best to be available for urgent issues outside of office hours, we are not available 24/7.   If you have an urgent issue and are unable to reach [place], you may choose to seek medical care at your doctor's office, retail clinic, urgent care center, or emergency room.  If you have a medical emergency, please immediately call 911 or go to the emergency department.  Pager Numbers  - Dr. Nehemiah Massed:  [phone]  - Dr. Laurence Ferrari: [phone]  - Dr. Nicole Kindred: [phone]  In the event of inclement weather, please call our main line at [phone] for an update on the status of any delays or closures.  Dermatology Medication Tips: Please keep the boxes that topical medications come in in order to help keep track of the instructions about where and how to use these. Pharmacies typically print the medication instructions only on the boxes and not directly on the medication tubes.   If your medication is too expensive, please contact our office at [phone] option 4 or send [place] a message through [place].   We are unable to tell what your co-pay for medications will be in advance as this is different depending on your insurance coverage. However, we may be able to find a substitute medication at lower cost or fill out paperwork to get insurance to cover a needed medication.   If a prior authorization is required to get your medication covered by your insurance company, please allow [place] 1-2 business days to complete this process.  Drug prices often vary depending on where the prescription is filled and some pharmacies may offer cheaper prices.  The website www.goodrx.com contains coupons for medications through different pharmacies. The prices here do not account for what the cost may be with help from insurance (it may be cheaper with your insurance), but the website can give you the price if you did not use any insurance.  - You can print the associated coupon and take it with your prescription to the pharmacy.  - You may also stop by our office during regular business hours and pick up a GoodRx coupon card.  - If you need your prescription sent electronically to a different pharmacy, notify our office through [facility] or by phone at [phone] option 4.     Si Usted Necesita Algo Despus de Su Visita  Tambin puede enviarnos un mensaje a travs de Pharmacist, community. Por lo general  respondemos a los mensajes de MyChart en el transcurso de 1 a 2 das hbiles.  Para renovar recetas, por favor pida a su farmacia que se ponga en contacto con nuestra oficina. Harland Dingwall de fax es [place] [phone].  Si tiene un asunto urgente cuando la clnica est cerrada y que no puede esperar hasta el siguiente da hbil, puede llamar/localizar a su doctor(a) al nmero que aparece a continuacin.   Por favor, tenga en cuenta que aunque hacemos todo lo posible para estar disponibles para asuntos urgentes fuera del horario de [place], no estamos disponibles las 24 horas del da, los 7 das de la [place].   Si tiene un problema urgente y no puede comunicarse con nosotros, puede optar por buscar atencin mdica  en el consultorio de su doctor(a), en una clnica privada, en un centro de atencin urgente o en una sala de emergencias.  Si tiene Engineering geologist, por favor llame inmediatamente al 911 o vaya a la sala de  emergencias.  Nmeros de bper  - Dr. Nehemiah Massed: [phone]  - Dra. Moye: [phone]  - Dra. Nicole Kindred: [phone]  En caso de inclemencias del [place], por favor llame a Johnsie Kindred principal al [phone] para una actualizacin sobre el [place] de cualquier retraso o cierre.  Consejos para la medicacin en dermatologa: Por favor, guarde las cajas en las que vienen los medicamentos de uso tpico para ayudarle a seguir las instrucciones sobre dnde y cmo usarlos. Las farmacias generalmente imprimen las instrucciones del medicamento slo en las cajas y no directamente en los tubos del [place].   Si su medicamento es muy caro, por favor, pngase en contacto con Zigmund Daniel llamando al [phone] y presione la opcin 4 o envenos un mensaje a travs de Pharmacist, community.   No podemos decirle cul ser su copago por los medicamentos por adelantado ya que esto es diferente dependiendo de la cobertura de su seguro. Sin embargo, es posible que podamos encontrar un  medicamento sustituto a Electrical engineer un formulario para que el seguro cubra el medicamento que se considera necesario.   Si se requiere una autorizacin previa para que su compaa de seguros [place] su medicamento, por favor permtanos de 1 a 2 das hbiles para completar este proceso.  Los precios de los medicamentos varan con frecuencia dependiendo del Environmental consultant de dnde se surte la receta y alguna farmacias pueden ofrecer precios ms baratos.  El sitio web www.goodrx.com tiene cupones para medicamentos de Airline pilot. Los precios aqu no tienen en cuenta lo que podra costar con la ayuda del seguro (puede ser ms barato con su seguro), pero el sitio web puede darle el precio si no utiliz Research scientist (physical sciences).  - Puede imprimir el cupn correspondiente y llevarlo con su receta a la farmacia.  - Tambin puede pasar por nuestra oficina durante el horario de atencin regular y Charity fundraiser una tarjeta de cupones de GoodRx.  - Si necesita que su receta se enve electrnicamente a una farmacia diferente, informe a nuestra oficina a travs de MyChart de [place] o por telfono llamando al [phone] y presione la opcin 4.

## 2022-06-03 ENCOUNTER — Telehealth: Payer: Self-pay

## 2022-06-03 NOTE — Telephone Encounter (Signed)
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

## 2022-06-03 NOTE — Telephone Encounter (Signed)
Advised patient biopsy of the left hand dorsum was SCC in situ, EDC scheduled for 07/03/2022 at 9:00 AM.

## 2022-06-14 ENCOUNTER — Ambulatory Visit (INDEPENDENT_AMBULATORY_CARE_PROVIDER_SITE_OTHER): Payer: Medicaid Other

## 2022-06-14 ENCOUNTER — Ambulatory Visit (INDEPENDENT_AMBULATORY_CARE_PROVIDER_SITE_OTHER): Payer: Medicaid Other | Admitting: Nurse Practitioner

## 2022-06-14 ENCOUNTER — Encounter (INDEPENDENT_AMBULATORY_CARE_PROVIDER_SITE_OTHER): Payer: Self-pay | Admitting: Nurse Practitioner

## 2022-06-14 VITALS — BP 140/85 | HR 71 | Resp 16 | Wt 217.8 lb

## 2022-06-14 DIAGNOSIS — I83813 Varicose veins of bilateral lower extremities with pain: Secondary | ICD-10-CM | POA: Diagnosis not present

## 2022-06-14 DIAGNOSIS — I340 Nonrheumatic mitral (valve) insufficiency: Secondary | ICD-10-CM

## 2022-06-14 DIAGNOSIS — E785 Hyperlipidemia, unspecified: Secondary | ICD-10-CM | POA: Diagnosis not present

## 2022-06-15 ENCOUNTER — Encounter (INDEPENDENT_AMBULATORY_CARE_PROVIDER_SITE_OTHER): Payer: Self-pay | Admitting: Nurse Practitioner

## 2022-06-15 NOTE — Progress Notes (Signed)
Subjective:    Patient ID: Faith Hill, female    DOB: [birth year], 41 y.o.   MRN: [id number] Chief Complaint  Patient presents with   Follow-up    Ultrasound follow up    The patient returns to the office for followup evaluation regarding leg swelling.  The swelling has persisted and the pain associated with swelling continues. There have not been any interval development of a ulcerations or wounds.   Since the previous visit the patient has been wearing graduated compression stockings and has noted little if any improvement in the lymphedema. The patient has been using compression routinely morning until night.   The patient also states elevation during the day and exercise is being done too.  Today noninvasive studies show no evidence of DVT or superficial thrombophlebitis bilaterally.  No evidence of deep venous insufficiency or superficial venous reflux noted.    Review of Systems  Cardiovascular:  Positive for leg swelling.  All other systems reviewed and are negative.      Objective:   Physical Exam Vitals reviewed.  HENT:     Head: Normocephalic.  Cardiovascular:     Rate and Rhythm: Normal rate.     Pulses: Normal pulses.  Pulmonary:     Effort: Pulmonary effort is normal.  Skin:    General: Skin is warm and dry.  Neurological:     Mental Status: She is alert and oriented to person, place, and time.  Psychiatric:        Mood and Affect: Mood normal.        Behavior: Behavior normal.        Thought Content: Thought content normal.        Judgment: Judgment normal.     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   in situ, left hand dorsum, EDC sched 07/03/22 at 9:00 AM    Social History   Socioeconomic History   Marital  status: Single    Spouse name: Not on file   Number of children: 1   Years of education: Not on file   Highest education level: Some college, no degree  Occupational History   Not on file  Tobacco Use   Smoking status: Former   Smokeless tobacco: Never   Tobacco comments:    1.25-1.5 ppd  Vaping Use   Vaping Use: Every day   Start date: 11/04/2010  Substance and Sexual Activity   Alcohol use: Yes    Comment: social   Drug use: Not Currently    Comment: quit 2009; h/o opioid abuse   Sexual activity: Yes    Partners: Male    Birth control/protection: Condom    Comment: sometimes  Other Topics Concern   Not on file  Social History Narrative   Caffeine: about 40 oz daily   Lives at home with her daughter and mother lives with them   Right handed   Social Determinants of Health   Financial Resource Strain: Not on file  Food Insecurity: Not on file  Transportation Needs: Not on file  Physical Activity: Not on file  Stress: Not on file  Social Connections: Not on file  Intimate Partner  Violence: Not At Risk (04/02/2022)   Humiliation, Afraid, Rape, and Kick questionnaire    Fear of Current or Ex-Partner: No    Emotionally Abused: No    Physically Abused: No    Sexually Abused: No    Past Surgical History:  Procedure Laterality Date   WRIST SURGERY Bilateral    pins     Family History  Problem Relation Age of Onset   Heart disease Mother    Breast cancer Sister 75   Heart disease Other     Allergies  Allergen Reactions   Sulfa Antibiotics Hives and Rash    SULFA DRUGS        No data to display            CMP  No results found for: "NA", "K", "CL", "CO2", "GLUCOSE", "BUN", "CREATININE", "CALCIUM", "PROT", "ALBUMIN", "AST", "ALT", "ALKPHOS", "BILITOT", "GFRNONAA", "GFRAA"   No results found.     Assessment & Plan:    1. Varicose veins of both lower extremities with pain The swelling peers to be better since last time I saw the patient.  We  discussed continued conservative therapy including use of medical grade compression.  She should also continue to elevate her lower extremities.  Exercise would also be helpful for the patient.  At this time there is no current role for intervention her varicosities.  The patient returns to [place] as needed.  2. Hyperlipidemia, unspecified hyperlipidemia type Continue statin as ordered and reviewed, no changes at this time   3. Mitral valve insufficiency, unspecified etiology The patient does have a history of mitral valve insufficiency.  She notes that has been several years since has been followed up.  This is a possible contributing factor of her swelling.  She is requested a referral to [facility] clinic. - Ambulatory referral to Cardiology  Current Outpatient Medications on File Prior to Visit  Medication Sig Dispense Refill   clobetasol (TEMOVATE) 0.05 % external solution Apply topically at bedtime.     METHADONE HCL PO Take 60 mg by mouth daily. Liquid     MULTIPLE VITAMIN PO Take by mouth.     torsemide (DEMADEX) 20 MG tablet Take 20 mg by mouth daily.     valACYclovir (VALTREX) 500 MG tablet SMARTSIG:1 Tablet(s) By Mouth Every 12 Hours     loratadine (CLARITIN) 10 MG tablet Take 10 mg by mouth daily. (Patient not taking: Reported on 06/14/2022)     [DISCONTINUED] glycopyrrolate (ROBINUL) 1 MG tablet Glycopyrrolate 1 MG Oral Tablet QTY: 30 tablet Days: 30 Refills: 1  Written: 10/23/18 Patient Instructions: Take 1 tablet by mouth daily in AM     No current facility-administered medications on file prior to visit.    There are no Patient Instructions on file for this visit. No follow-ups on file.   Kris Hartmann, NP

## 2022-07-03 ENCOUNTER — Ambulatory Visit: Payer: Medicaid Other | Admitting: Dermatology

## 2022-07-03 ENCOUNTER — Encounter: Payer: Self-pay | Admitting: Dermatology

## 2022-07-03 DIAGNOSIS — L820 Inflamed seborrheic keratosis: Secondary | ICD-10-CM | POA: Diagnosis not present

## 2022-07-03 DIAGNOSIS — L219 Seborrheic dermatitis, unspecified: Secondary | ICD-10-CM | POA: Diagnosis not present

## 2022-07-03 DIAGNOSIS — D0462 Carcinoma in situ of skin of left upper limb, including shoulder: Secondary | ICD-10-CM

## 2022-07-03 DIAGNOSIS — D099 Carcinoma in situ, unspecified: Secondary | ICD-10-CM

## 2022-07-03 MED ORDER — CALCIPOTRIENE 0.005 % EX CREA: TOPICAL_CREAM | CUTANEOUS | 0 refills | Status: DC

## 2022-07-03 MED ORDER — CICLOPIROX 1 % EX SHAM
MEDICATED_SHAMPOO | CUTANEOUS | 2 refills | Status: AC
Start: 2022-07-03 — End: ?

## 2022-07-03 MED ORDER — FLUOROURACIL 5 % EX CREA
TOPICAL_CREAM | Freq: Two times a day (BID) | CUTANEOUS | 0 refills | Status: DC
Start: 2022-07-03 — End: 2023-11-23

## 2022-07-03 NOTE — Patient Instructions (Addendum)
Recommend Ciclopirox shampoo 2-3x per week, massage into scalp and let sit 3-5 minutes before rinsing. Prescription sent to pharmacy.   5-fluorouracil/calcipotriene cream is is a type of field treatment used to treat precancers, thin skin cancers, and areas of sun damage. Expected reaction includes irritation and mild inflammation potentially progressing to more severe inflammation including redness, scaling, crusting and open sores/erosions.  If too much irritation occurs, ensure application of only a thin layer and decrease frequency of use to achieve a tolerable level of inflammation. Recommend applying Vaseline ointment to open sores as needed.  Minimize sun exposure while under treatment. Recommend daily broad spectrum sunscreen SPF 30+ to sun-exposed areas, reapply every 2 hours as needed.   Cryotherapy Aftercare  Wash gently with soap and water everyday.   Apply Vaseline and Band-Aid daily until healed.     Due to recent changes in healthcare laws, you may see results of your pathology and/or laboratory studies on MyChart before the doctors have had a chance to review them. We understand that in some cases there may be results that are confusing or concerning to you. Please understand that not all results are received at the same time and often the doctors may need to interpret multiple results in order to provide you with the best plan of care or course of treatment. Therefore, we ask that you please give [place] 2 business days to thoroughly review all your results before contacting the office for clarification. Should we see a critical lab result, you will be contacted sooner.   If You Need Anything After Your Visit  If you have any questions or concerns for your doctor, please call our main line at [phone] and press option 4 to reach your doctor's medical assistant. If no one answers, please leave a voicemail as directed and we will return your call as soon as possible. Messages left after  4 pm will be answered the following business day.   You may also send [place] a message via [place]. We typically respond to MyChart messages within 1-2 business days.  For prescription refills, please ask your pharmacy to contact our office. Our fax number is [phone].  If you have an urgent issue when the clinic is closed that cannot wait until the next business day, you can page your doctor at the number below.    Please note that while we do our best to be available for urgent issues outside of office hours, we are not available 24/7.   If you have an urgent issue and are unable to reach [place], you may choose to seek medical care at your doctor's office, retail clinic, urgent care center, or emergency room.  If you have a medical emergency, please immediately call 911 or go to the emergency department.  Pager Numbers  - Dr. Nehemiah Massed: [phone]  - Dr. Laurence Ferrari: [phone]  - Dr. Nicole Kindred: [phone]  In the event of inclement weather, please call our main line at [phone] for an update on the status of any delays or closures.  Dermatology Medication Tips: Please keep the boxes that topical medications come in in order to help keep track of the instructions about where and how to use these. Pharmacies typically print the medication instructions only on the boxes and not directly on the medication tubes.   If your medication is too expensive, please contact our office at [phone] option 4 or send [place] a message through [place].   We are unable to tell what your co-pay for medications  will be in advance as this is different depending on your insurance coverage. However, we may be able to find a substitute medication at lower cost or fill out paperwork to get insurance to cover a needed medication.   If a prior authorization is required to get your medication covered by your insurance company, please allow [place] 1-2 business days to complete this process.  Drug prices often vary  depending on where the prescription is filled and some pharmacies may offer cheaper prices.  The website www.goodrx.com contains coupons for medications through different pharmacies. The prices here do not account for what the cost may be with help from insurance (it may be cheaper with your insurance), but the website can give you the price if you did not use any insurance.  - You can print the associated coupon and take it with your prescription to the pharmacy.  - You may also stop by our office during regular business hours and pick up a GoodRx coupon card.  - If you need your prescription sent electronically to a different pharmacy, notify our office through [place] or by phone at [phone] option 4.     Si Usted Necesita Algo Despus de Su Visita  Tambin puede enviarnos un mensaje a travs de Pharmacist, community. Por lo general respondemos a los mensajes de MyChart en el transcurso de 1 a 2 das hbiles.  Para renovar recetas, por favor pida a su farmacia que se ponga en contacto con nuestra oficina. Harland Dingwall de fax es [place] [phone].  Si tiene un asunto urgente cuando la clnica est cerrada y que no puede esperar hasta el siguiente da hbil, puede llamar/localizar a su doctor(a) al nmero que aparece a continuacin.   Por favor, tenga en cuenta que aunque hacemos todo lo posible para estar disponibles para asuntos urgentes fuera del horario de [place], no estamos disponibles las 24 horas del da, los 7 das de la [place].   Si tiene un problema urgente y no puede comunicarse con nosotros, puede optar por buscar atencin mdica  en el consultorio de su doctor(a), en una clnica privada, en un centro de atencin urgente o en una sala de emergencias.  Si tiene Engineering geologist, por favor llame inmediatamente al 911 o vaya a la sala de emergencias.  Nmeros de bper  - Dr. Nehemiah Massed: [phone]  - Dra. Moye: [phone]  - Dra. Nicole Kindred: [phone]  En caso de  inclemencias del [place], por favor llame a Johnsie Kindred principal al [phone] para una actualizacin sobre el [place] de cualquier retraso o cierre.  Consejos para la medicacin en dermatologa: Por favor, guarde las cajas en las que vienen los medicamentos de uso tpico para ayudarle a seguir las instrucciones sobre dnde y cmo usarlos. Las farmacias generalmente imprimen las instrucciones del medicamento slo en las cajas y no directamente en los tubos del [place].   Si su medicamento es muy caro, por favor, pngase en contacto con Zigmund Daniel llamando al [phone] y presione la opcin 4 o envenos un mensaje a travs de Pharmacist, community.   No podemos decirle cul ser su copago por los medicamentos por adelantado ya que esto es diferente dependiendo de la cobertura de su seguro. Sin embargo, es posible que podamos encontrar un medicamento sustituto a Electrical engineer un formulario para que el seguro cubra el medicamento que se considera necesario.   Si se requiere una autorizacin previa para que su compaa de seguros [place] su medicamento, por favor permtanos de  1 a 2 das hbiles para completar este proceso.  Los precios de los medicamentos varan con frecuencia dependiendo del Environmental consultant de dnde se surte la receta y alguna farmacias pueden ofrecer precios ms baratos.  El sitio web www.goodrx.com tiene cupones para medicamentos de Airline pilot. Los precios aqu no tienen en cuenta lo que podra costar con la ayuda del seguro (puede ser ms barato con su seguro), pero el sitio web puede darle el precio si no utiliz Research scientist (physical sciences).  - Puede imprimir el cupn correspondiente y llevarlo con su receta a la farmacia.  - Tambin puede pasar por nuestra oficina durante el horario de atencin regular y Charity fundraiser una tarjeta de cupones de GoodRx.  - Si necesita que su receta se enve electrnicamente a una farmacia diferente, informe a nuestra oficina a travs de MyChart de [place] o  por telfono llamando al [phone] y presione la opcin 4.

## 2022-07-03 NOTE — Progress Notes (Signed)
Follow-Up Visit   Subjective  Faith Hill is a 41 y.o. female who presents for the following: SCC in situ (Left hand dorsum, biopsy proven. EDC today. ) and Growth (Right foot dorsum x 8-9 months. Feels a pain and soreness in this area at times.). Patient also has bumps/scaling of the scalp. She was prescribed clobetasol from her PCP, but she doesn't think it helps.    The following portions of the chart were reviewed this encounter and updated as appropriate:       Review of Systems:  No other skin or systemic complaints except as noted in HPI or Assessment and Plan.  Objective  Well appearing patient in no apparent distress; mood and affect are within normal limits.  A focused examination was performed including face, hand. Relevant physical exam findings are noted in the Assessment and Plan.  Left hand dorsum Pink biopsy site.  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 Apply to affected area left hand.  calcipotriene (DOVONOX) 0.005 % cream Apply to affected area left hand twice a day as directed.  Biopsy proven.  Discussed treatment options, EDC vs topical 5FU. Patient prefers to treat topically due to job.  - Start 5-fluorouracil/calcipotriene cream twice a day to affected areas including left hand dorsum. Prescriptions sent to [facility].   5-fluorouracil/calcipotriene cream is is a type of field treatment used to treat precancers, thin skin cancers, and areas of sun damage. Expected reaction includes irritation and mild inflammation potentially progressing to more severe inflammation including redness, scaling, crusting and open sores/erosions.  If too much irritation occurs, ensure application of only a thin layer and decrease frequency of use to achieve  a tolerable level of inflammation. Recommend applying Vaseline ointment to open sores as needed.  Minimize sun exposure while under treatment. Recommend daily broad spectrum sunscreen SPF 30+ to sun-exposed areas, reapply every 2 hours as needed.        Inflamed seborrheic keratosis Right foot dorsum  Symptomatic, irritating, patient would like treated.  Destruction of lesion - Right foot dorsum  Destruction method: cryotherapy   Informed consent: discussed and consent obtained   Lesion destroyed using liquid nitrogen: Yes   Region frozen until ice ball extended beyond lesion: Yes   Outcome: patient tolerated procedure well with no complications   Post-procedure details: wound care instructions given   Additional details:  Prior to procedure, discussed risks of blister formation, small wound, skin dyspigmentation, or rare scar following cryotherapy. Recommend Vaseline ointment to treated areas while healing.   Seborrheic dermatitis scalp  Chronic and persistent condition with duration or expected duration over one year. Condition is symptomatic/ bothersome to patient. Not currently at goal.   Seborrheic Dermatitis  -  is a chronic persistent rash characterized by pinkness and scaling most commonly of the mid face but also can occur on the scalp (dandruff), ears; mid chest, mid back and groin.  It tends to be exacerbated by stress and cooler weather.  People who have neurologic disease may experience new onset or exacerbation of existing seborrheic dermatitis.  The condition is not curable but treatable and can be controlled.  Start ciclopirox shampoo 2-3x/wk - massage into scalp, let sit several minutes before rinsing. Continue clobetasol solution - apply 1-2 gtts to AA scalp prn  itch, pt has   Ciclopirox 1 % shampoo - scalp Massage into scalp and let sit several minutes before rinsing. Use 2-3 times a week.   Return in about 6 weeks (around 08/14/2022) for recheck SCCIS L  hand.  IJamesetta Orleans, CMA, am acting as scribe for Brendolyn Patty, MD .  Documentation: I have reviewed the above documentation for accuracy and completeness, and I agree with the above.  Brendolyn Patty MD

## 2022-07-04 ENCOUNTER — Telehealth: Payer: Self-pay

## 2022-07-04 NOTE — Telephone Encounter (Signed)
Ciclopirox Shampoo is not on formulary for medicaid and not approved. They want to switch to Ciclopirox cream or solution. Please advise

## 2022-07-09 ENCOUNTER — Other Ambulatory Visit: Payer: Self-pay

## 2022-07-09 MED ORDER — KETOCONAZOLE 2 % EX SHAM: MEDICATED_SHAMPOO | CUTANEOUS | 2 refills | Status: DC

## 2022-07-09 NOTE — Progress Notes (Signed)
Ciclopirox not covered. Sent in Ketoconazole 2% per Dr. Les Pou instructions

## 2022-08-12 ENCOUNTER — Other Ambulatory Visit: Payer: Self-pay | Admitting: Dermatology

## 2022-08-12 DIAGNOSIS — D099 Carcinoma in situ, unspecified: Secondary | ICD-10-CM

## 2022-08-27 ENCOUNTER — Ambulatory Visit: Payer: Medicaid Other | Admitting: Dermatology

## 2022-08-28 ENCOUNTER — Ambulatory Visit: Payer: Medicaid Other | Admitting: Dermatology

## 2022-09-02 ENCOUNTER — Encounter (INDEPENDENT_AMBULATORY_CARE_PROVIDER_SITE_OTHER): Payer: Self-pay

## 2022-09-11 ENCOUNTER — Ambulatory Visit: Payer: Medicaid Other | Admitting: Dermatology

## 2022-09-16 ENCOUNTER — Other Ambulatory Visit: Payer: Self-pay | Admitting: Family

## 2022-09-16 DIAGNOSIS — R1900 Intra-abdominal and pelvic swelling, mass and lump, unspecified site: Secondary | ICD-10-CM

## 2022-09-25 ENCOUNTER — Other Ambulatory Visit: Payer: Medicaid Other

## 2022-09-25 ENCOUNTER — Ambulatory Visit: Payer: Medicaid Other

## 2022-11-12 ENCOUNTER — Ambulatory Visit: Payer: Medicaid Other | Admitting: Podiatry

## 2022-11-26 ENCOUNTER — Other Ambulatory Visit: Payer: Self-pay | Admitting: Podiatry

## 2022-11-26 ENCOUNTER — Ambulatory Visit: Payer: Medicaid Other | Admitting: Podiatry

## 2022-11-26 ENCOUNTER — Ambulatory Visit (INDEPENDENT_AMBULATORY_CARE_PROVIDER_SITE_OTHER): Payer: Medicaid Other

## 2022-11-26 VITALS — BP 122/74

## 2022-11-26 DIAGNOSIS — M722 Plantar fascial fibromatosis: Secondary | ICD-10-CM

## 2022-11-26 MED ORDER — BETAMETHASONE SOD PHOS & ACET 6 (3-3) MG/ML IJ SUSP
3.0000 mg | Freq: Once | INTRAMUSCULAR | Status: AC
  Administered 2022-11-26: 3 mg via INTRA_ARTICULAR

## 2022-11-26 MED ORDER — METHYLPREDNISOLONE 4 MG PO TBPK: ORAL_TABLET | ORAL | 0 refills | Status: DC

## 2022-11-26 NOTE — Progress Notes (Signed)
   Chief Complaint  Patient presents with   Plantar Fasciitis    Plantar fasciitis right foot heel and arch, left foot soft tissue mass, Started in dec 2023, X-rays taken    Subjective: 42 y.o. female presenting today as a reestablish new patient for evaluation of pain and tenderness associated to the right plantar heel.  Patient does have a history of plantar fasciitis.  She says over the past few months the pain has returned.  She was last seen in the office 2020.  Patient states that over the last 3 years she has not had much pain with exception of the last few months.  Denies a history of injury.  Gradual onset.  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  Dermatology: Skin is warm, dry and supple bilateral lower extremities. Negative for open lesions or macerations bilateral.   Vascular: Dorsalis Pedis and Posterior Tibial pulses palpable bilateral.  Capillary fill time is immediate to all digits.  Neurological: Epicritic and protective threshold intact bilateral.   Musculoskeletal: Tenderness to palpation to the plantar aspect of the right heel along the plantar fascia. All other joints range of motion within normal limits bilateral. Strength 5/5 in all groups bilateral.   Assessment: 1. Plantar fasciitis right  Plan of Care:  1. Patient evaluated.    2. Injection of 0.5cc Celestone soluspan injected into the right plantar fascia  3. Rx for Medrol Dose Pack placed 4.  Patient has a prescription for meloxicam from her PCP.  Continue as prescribed 5. Plantar fascial band(s) dispensed 6. Instructed patient regarding therapies and modalities at home to alleviate symptoms.  7.  Return to clinic in 4 weeks.     Edrick Kins, DPM [facility]  Dr. Edrick Kins, DPM    [address], [place] [address]                Office [phone]  Fax [phone]

## 2022-12-16 ENCOUNTER — Other Ambulatory Visit: Payer: Self-pay | Admitting: Nurse Practitioner

## 2022-12-17 ENCOUNTER — Ambulatory Visit
Admission: RE | Admit: 2022-12-17 | Discharge: 2022-12-17 | Disposition: A | Discharge status: Home / Self Care | Payer: Medicaid Other | Attending: Nurse Practitioner | Admitting: Nurse Practitioner

## 2022-12-17 ENCOUNTER — Ambulatory Visit
Admission: RE | Admit: 2022-12-17 | Discharge: 2022-12-17 | Disposition: A | Discharge status: Home / Self Care | Payer: Medicaid Other | Source: Ambulatory Visit | Attending: Nurse Practitioner | Admitting: Nurse Practitioner

## 2022-12-17 ENCOUNTER — Other Ambulatory Visit: Payer: Self-pay | Admitting: Nurse Practitioner

## 2022-12-17 DIAGNOSIS — M545 Low back pain, unspecified: Secondary | ICD-10-CM | POA: Diagnosis not present

## 2022-12-18 ENCOUNTER — Other Ambulatory Visit: Payer: Self-pay | Admitting: Nurse Practitioner

## 2022-12-19 NOTE — Progress Notes (Signed)
Patient informed. 

## 2022-12-20 ENCOUNTER — Other Ambulatory Visit: Payer: Self-pay | Admitting: Internal Medicine

## 2022-12-20 ENCOUNTER — Ambulatory Visit
Admission: RE | Admit: 2022-12-20 | Discharge: 2022-12-20 | Disposition: A | Discharge status: Home / Self Care | Payer: Medicaid Other | Source: Ambulatory Visit | Attending: Internal Medicine | Admitting: Internal Medicine

## 2022-12-20 DIAGNOSIS — M7989 Other specified soft tissue disorders: Secondary | ICD-10-CM | POA: Diagnosis present

## 2022-12-31 ENCOUNTER — Encounter: Payer: Medicaid Other | Admitting: Podiatry

## 2023-03-25 ENCOUNTER — Other Ambulatory Visit: Payer: Self-pay | Admitting: Nurse Practitioner

## 2023-05-19 ENCOUNTER — Ambulatory Visit: Payer: Medicaid Other | Admitting: Cardiology

## 2023-05-19 ENCOUNTER — Encounter: Payer: Self-pay | Admitting: Cardiology

## 2023-05-19 VITALS — BP 119/70 | HR 72 | Ht 71.0 in | Wt 236.4 lb

## 2023-05-19 DIAGNOSIS — M545 Low back pain, unspecified: Secondary | ICD-10-CM | POA: Diagnosis not present

## 2023-05-19 DIAGNOSIS — F32A Depression, unspecified: Secondary | ICD-10-CM

## 2023-05-19 DIAGNOSIS — F411 Generalized anxiety disorder: Secondary | ICD-10-CM | POA: Diagnosis not present

## 2023-05-19 DIAGNOSIS — G8929 Other chronic pain: Secondary | ICD-10-CM

## 2023-05-19 LAB — POCT URINALYSIS DIPSTICK
Bilirubin, UA: NEGATIVE
Glucose, UA: NEGATIVE
Ketones, UA: NEGATIVE
Leukocytes, UA: NEGATIVE
Nitrite, UA: NEGATIVE
Protein, UA: NEGATIVE
Spec Grav, UA: 1.02 (ref 1.010–1.025)
Urobilinogen, UA: 0.2 E.U./dL
pH, UA: 7 (ref 5.0–8.0)

## 2023-05-19 MED ORDER — VENLAFAXINE HCL ER 37.5 MG PO CP24: 37.5000 mg | ORAL_CAPSULE | Freq: Every day | ORAL | 0 refills | Status: DC

## 2023-05-19 MED ORDER — ESCITALOPRAM OXALATE 5 MG PO TABS: 10.0000 mg | ORAL_TABLET | Freq: Every day | ORAL | 2 refills | Status: DC

## 2023-05-19 NOTE — Progress Notes (Signed)
Established Patient Office Visit  Subjective:  Patient ID: Faith Hill, female    DOB: Dec 31, 1980  Age: 42 y.o. MRN: [id number]  Chief Complaint  Patient presents with   Acute Visit    Severe lower back pain    Patient in office complaining of severe lower back pain. Patient has been having ongoing lower back pain for the past 6 months. Patient states pain does not occur when she is sitting, only when standing. Patient reports she is starting to feel numbness in her toes. Lumbar xray done in February 2024 was normal. Patient states she has tried heating pad, ice packs, muscle relaxer, ibuprofen with no relief. Patient requesting a referral to a chiropractor. Patient has medicaid. Patient's insurance will not cover a chiropractor. Suggested patient pay out of pocket. Patient also complaining of an abnormal vaginal discharge. States she started her period today. Recommend patient call back after period ends if the discharge continues.  Patient complaining of lack of interest in activities that she previously enjoyed. PHQ9 and GAD7 completed. Patient has taken Wellbutrin, Zoloft and Effexor in the past with no relief of symptoms, will try Lexapro 5 mg daily.   Back Pain This is a chronic problem. The current episode started more than 1 year ago. The problem occurs intermittently. The problem has been gradually worsening since onset. The pain is present in the lumbar spine. The quality of the pain is described as aching. The pain does not radiate. The pain is mild. The symptoms are aggravated by position, sitting and standing. Associated symptoms include numbness and tingling. Pertinent negatives include no abdominal pain, chest pain or headaches. Risk factors include obesity, poor posture and sedentary lifestyle. She has tried analgesics, home exercises, muscle relaxant and NSAIDs for the symptoms. The treatment provided no relief.     No other concerns at this time.   Past Medical  History:  Diagnosis Date   H/O opioid abuse (HCC)    sober since 2009   Migraine without aura    Squamous cell carcinoma of skin 05/28/2022   in situ, left hand dorsum, [facility] 07/03/22    Past Surgical History:  Procedure Laterality Date   WRIST SURGERY Bilateral    pins     Social History   Socioeconomic History   Marital status: Single    Spouse name: Not on file   Number of children: 1   Years of education: Not on file   Highest education level: Some college, no degree  Occupational History   Not on file  Tobacco Use   Smoking status: Former   Smokeless tobacco: Never   Tobacco comments:    1.25-1.5 ppd  Vaping Use   Vaping status: Every Day   Start date: 11/04/2010  Substance and Sexual Activity   Alcohol use: Yes    Comment: social   Drug use: Not Currently    Comment: quit 2009; h/o opioid abuse   Sexual activity: Yes    Partners: Male    Birth control/protection: Condom    Comment: sometimes  Other Topics Concern   Not on file  Social History Narrative   Caffeine: about 40 oz daily   Lives at home with her daughter and mother lives with them   Right handed   Social Determinants of Health   Financial Resource Strain: Not on file  Food Insecurity: Not on file  Transportation Needs: Not on file  Physical Activity: Not on file  Stress: Not on file  Social Connections:  Not on file  Intimate Partner Violence: Not At Risk (04/02/2022)   Humiliation, Afraid, Rape, and Kick questionnaire    Fear of Current or Ex-Partner: No    Emotionally Abused: No    Physically Abused: No    Sexually Abused: No    Family History  Problem Relation Age of Onset   Heart disease Mother    Breast cancer Sister 65   Heart disease Other     Allergies  Allergen Reactions   Sulfa Antibiotics Hives and Rash    SULFA DRUGS    Review of Systems  Constitutional: Negative.   HENT: Negative.    Eyes: Negative.   Respiratory: Negative.  Negative for shortness of breath.    Cardiovascular: Negative.  Negative for chest pain.  Gastrointestinal: Negative.  Negative for abdominal pain, constipation and diarrhea.  Genitourinary: Negative.   Musculoskeletal:  Positive for back pain. Negative for joint pain and myalgias.  Skin: Negative.   Neurological:  Positive for tingling and numbness. Negative for dizziness and headaches.  Endo/Heme/Allergies: Negative.   All other systems reviewed and are negative.      Objective:   BP 119/70   Pulse 72   Ht 5\' 11"  (1.803 m)   Wt 236 lb 6.4 oz (107.2 kg)   SpO2 99%   BMI 32.97 kg/m   Vitals:   05/19/23 1421  BP: 119/70  Pulse: 72  Height: 5\' 11"  (1.803 m)  Weight: 236 lb 6.4 oz (107.2 kg)  SpO2: 99%  BMI (Calculated): 32.99    Physical Exam Vitals and nursing note reviewed.  Constitutional:      Appearance: Normal appearance. She is normal weight.  HENT:     Head: Normocephalic and atraumatic.     Nose: Nose normal.     Mouth/Throat:     Mouth: Mucous membranes are moist.  Eyes:     Extraocular Movements: Extraocular movements intact.     Conjunctiva/sclera: Conjunctivae normal.     Pupils: Pupils are equal, round, and reactive to light.  Cardiovascular:     Rate and Rhythm: Normal rate and regular rhythm.     Pulses: Normal pulses.     Heart sounds: Normal heart sounds.  Pulmonary:     Effort: Pulmonary effort is normal.     Breath sounds: Normal breath sounds.  Abdominal:     General: Abdomen is flat. Bowel sounds are normal.     Palpations: Abdomen is soft.  Musculoskeletal:        General: Normal range of motion.     Cervical back: Normal range of motion.  Skin:    General: Skin is warm and dry.  Neurological:     General: No focal deficit present.     Mental Status: She is alert and oriented to person, place, and time.  Psychiatric:        Mood and Affect: Mood normal.        Behavior: Behavior normal.        Thought Content: Thought content normal.        Judgment: Judgment  normal.      Results for orders placed or performed in visit on 05/19/23  POCT Urinalysis Dipstick (81002)  Result Value Ref Range   Color, UA     Clarity, UA     Glucose, UA Negative Negative   Bilirubin, UA negative    Ketones, UA negative    Spec Grav, UA 1.020 1.010 - 1.025   Blood, UA large  pH, UA 7.0 5.0 - 8.0   Protein, UA Negative Negative   Urobilinogen, UA 0.2 0.2 or 1.0 E.U./dL   Nitrite, UA negative    Leukocytes, UA Negative Negative   Appearance     Odor      Recent Results (from the past 2160 hour(s))  POCT Urinalysis Dipstick (09811)     Status: None   Collection Time: 05/19/23  2:30 PM  Result Value Ref Range   Color, UA     Clarity, UA     Glucose, UA Negative Negative   Bilirubin, UA negative    Ketones, UA negative    Spec Grav, UA 1.020 1.010 - 1.025   Blood, UA large    pH, UA 7.0 5.0 - 8.0   Protein, UA Negative Negative   Urobilinogen, UA 0.2 0.2 or 1.0 E.U./dL   Nitrite, UA negative    Leukocytes, UA Negative Negative   Appearance     Odor        05/19/2023    3:07 PM  GAD 7 : Generalized Anxiety Score  Nervous, Anxious, on Edge 1  Control/stop worrying 2  Worry too much - different things 3  Trouble relaxing 3  Restless 0  Easily annoyed or irritable 2  Afraid - awful might happen 0  Total GAD 7 Score 11       05/19/2023    3:06 PM 04/02/2022    3:50 PM  Depression screen PHQ 2/9  Decreased Interest 3 1  Down, Depressed, Hopeless 2 0  PHQ - 2 Score 5 1  Altered sleeping 2   Tired, decreased energy 3   Change in appetite 3   Feeling bad or failure about yourself  0   Trouble concentrating 1   Moving slowly or fidgety/restless 0   Suicidal thoughts 0   PHQ-9 Score 14        Assessment & Plan:  Patient to call chiropractor on her own to get an appointment.  Call office if abnormal vaginal discharge continues after period ends.  Lexapro 5 mg daily for depression and anxiety.  Problem List Items Addressed This  Visit       Other   Anxiety state   Relevant Medications   escitalopram (LEXAPRO) 5 MG tablet   Low back pain - Primary   Relevant Orders   POCT Urinalysis Dipstick (91478) (Completed)   Depression   Relevant Medications   escitalopram (LEXAPRO) 5 MG tablet    Return in about 4 weeks (around 06/16/2023).   Total time spent: 40 minutes  Sadhana Frater, NP  05/19/2023   This document may have been prepared by Dragon Voice Recognition software and as such may include unintentional dictation errors.

## 2023-05-19 NOTE — Addendum Note (Signed)
Addended by: Marisue Ivan on: 05/19/2023 04:01 PM   Modules accepted: Level of Service

## 2023-05-20 ENCOUNTER — Telehealth: Payer: Self-pay | Admitting: Nurse Practitioner

## 2023-05-20 NOTE — Telephone Encounter (Signed)
Patient left VM that she was prescribed Effexor and Lexapro yesterday but she and Amber only discussed the Effexor. She wanted to be sure that she should be taking the Lexapro before she picks it up. Please advise.

## 2023-06-16 ENCOUNTER — Ambulatory Visit: Payer: Medicaid Other | Admitting: Internal Medicine

## 2023-07-02 ENCOUNTER — Other Ambulatory Visit: Payer: Self-pay

## 2023-07-03 MED ORDER — OMEPRAZOLE 20 MG PO CPDR: 40.0000 mg | DELAYED_RELEASE_CAPSULE | Freq: Every day | ORAL | 2 refills | Status: DC

## 2023-07-08 ENCOUNTER — Other Ambulatory Visit: Payer: Self-pay | Admitting: Cardiology

## 2023-10-03 ENCOUNTER — Other Ambulatory Visit: Payer: Self-pay | Admitting: Cardiology

## 2023-10-13 ENCOUNTER — Ambulatory Visit: Payer: Medicaid Other | Admitting: Cardiology

## 2023-10-15 ENCOUNTER — Encounter: Payer: Self-pay | Admitting: Dermatology

## 2023-10-15 ENCOUNTER — Other Ambulatory Visit: Payer: Self-pay

## 2023-10-15 ENCOUNTER — Ambulatory Visit: Payer: Medicaid Other | Admitting: Dermatology

## 2023-10-15 DIAGNOSIS — L729 Follicular cyst of the skin and subcutaneous tissue, unspecified: Secondary | ICD-10-CM

## 2023-10-15 DIAGNOSIS — N907 Vulvar cyst: Secondary | ICD-10-CM

## 2023-10-15 DIAGNOSIS — L7211 Pilar cyst: Secondary | ICD-10-CM | POA: Diagnosis not present

## 2023-10-15 DIAGNOSIS — Z86007 Personal history of in-situ neoplasm of skin: Secondary | ICD-10-CM | POA: Diagnosis not present

## 2023-10-15 DIAGNOSIS — L732 Hidradenitis suppurativa: Secondary | ICD-10-CM

## 2023-10-15 MED ORDER — CLINDAMYCIN PHOSPHATE 1 % EX LOTN: TOPICAL_LOTION | CUTANEOUS | 1 refills | Status: DC

## 2023-10-15 MED ORDER — DOXYCYCLINE MONOHYDRATE 100 MG PO CAPS: 100.0000 mg | ORAL_CAPSULE | Freq: Two times a day (BID) | ORAL | 0 refills | Status: DC

## 2023-10-15 NOTE — Patient Instructions (Addendum)
Start doxycycline 100 MG - take 1 capsule by mouth twice daily for 2 weeks, then decrease to once daily until finished.  Doxycycline should be taken with food to prevent nausea. Do not lay down for 30 minutes after taking. Be cautious with sun exposure and use good sun protection while on this medication. Pregnant women should not take this medication.  Start Clindamycin lotion - apply to affected area in groin once daily after shower.   Due to recent changes in healthcare laws, you may see results of your pathology and/or laboratory studies on MyChart before the doctors have had a chance to review them. We understand that in some cases there may be results that are confusing or concerning to you. Please understand that not all results are received at the same time and often the doctors may need to interpret multiple results in order to provide you with the best plan of care or course of treatment. Therefore, we ask that you please give [place] 2 business days to thoroughly review all your results before contacting the office for clarification. Should we see a critical lab result, you will be contacted sooner.   If You Need Anything After Your Visit  If you have any questions or concerns for your doctor, please call our main line at [phone] and press option 4 to reach your doctor's medical assistant. If no one answers, please leave a voicemail as directed and we will return your call as soon as possible. Messages left after 4 pm will be answered the following business day.   You may also send [place] a message via MyChart. We typically respond to MyChart messages within 1-2 business days.  For prescription refills, please ask your pharmacy to contact our office. Our fax number is [phone].  If you have an urgent issue when the clinic is closed that cannot wait until the next business day, you can page your doctor at the number below.    Please note that while we do our best to be available for urgent  issues outside of office hours, we are not available 24/7.   If you have an urgent issue and are unable to reach [place], you may choose to seek medical care at your doctor's office, retail clinic, urgent care center, or emergency room.  If you have a medical emergency, please immediately call 911 or go to the emergency department.  Pager Numbers  - Dr. Gwen Pounds: [phone]  - Dr. Roseanne Reno: [phone]  - Dr. Katrinka Blazing: [phone]   In the event of inclement weather, please call our main line at [phone] for an update on the status of any delays or closures.  Dermatology Medication Tips: Please keep the boxes that topical medications come in in order to help keep track of the instructions about where and how to use these. Pharmacies typically print the medication instructions only on the boxes and not directly on the medication tubes.   If your medication is too expensive, please contact our office at [phone] option 4 or send [place] a message through MyChart.   We are unable to tell what your co-pay for medications will be in advance as this is different depending on your insurance coverage. However, we may be able to find a substitute medication at lower cost or fill out paperwork to get insurance to cover a needed medication.   If a prior authorization is required to get your medication covered by your insurance company, please allow [place] 1-2 business days to complete this process.  Drug prices often vary depending on where the prescription is filled and some pharmacies may offer cheaper prices.  The website www.goodrx.com contains coupons for medications through different pharmacies. The prices here do not account for what the cost may be with help from insurance (it may be cheaper with your insurance), but the website can give you the price if you did not use any insurance.  - You can print the associated coupon and take it with your prescription to the pharmacy.  - You may also stop  by our office during regular business hours and pick up a GoodRx coupon card.  - If you need your prescription sent electronically to a different pharmacy, notify our office through [facility] or by phone at [phone] option 4.     Si Usted Necesita Algo Despus de Su Visita  Tambin puede enviarnos un mensaje a travs de Clinical cytogeneticist. Por lo general respondemos a los mensajes de MyChart en el transcurso de 1 a 2 das hbiles.  Para renovar recetas, por favor pida a su farmacia que se ponga en contacto con nuestra oficina. Annie Sable de fax es [place] [phone].  Si tiene un asunto urgente cuando la clnica est cerrada y que no puede esperar hasta el siguiente da hbil, puede llamar/localizar a su doctor(a) al nmero que aparece a continuacin.   Por favor, tenga en cuenta que aunque hacemos todo lo posible para estar disponibles para asuntos urgentes fuera del horario de [place], no estamos disponibles las 24 horas del da, los 7 [address]  [address] de la [place].   Si tiene un problema urgente y no puede comunicarse con nosotros, puede optar por buscar atencin mdica  en el consultorio de su doctor(a), en una clnica privada, en un centro de atencin urgente o en una sala de emergencias.  Si tiene Engineer, drilling, por favor llame inmediatamente al 911 o vaya a la sala de emergencias.  Nmeros de bper  - Dr. Gwen Pounds: [phone]  - Dra. Roseanne Reno: [phone]  - Dr. Katrinka Blazing: [phone]   En caso de inclemencias del tiempo, por favor llame a Lacy Duverney principal al [phone] para una actualizacin sobre el [place] de cualquier retraso o cierre.  Consejos para la medicacin en dermatologa: Por favor, guarde las cajas en las que vienen los medicamentos de uso tpico para ayudarle a seguir las instrucciones sobre dnde y cmo usarlos. Las farmacias generalmente imprimen las instrucciones del medicamento slo en las cajas y no directamente en los tubos del [place].   Si su  medicamento es muy caro, por favor, pngase en contacto con Rolm Gala llamando al [phone] y presione la opcin 4 o envenos un mensaje a travs de Clinical cytogeneticist.   No podemos decirle cul ser su copago por los medicamentos por adelantado ya que esto es diferente dependiendo de la cobertura de su seguro. Sin embargo, es posible que podamos encontrar un medicamento sustituto a Audiological scientist un formulario para que el seguro cubra el medicamento que se considera necesario.   Si se requiere una autorizacin previa para que su compaa de seguros [place] su medicamento, por favor permtanos de 1 a 2 das hbiles para completar [address]  Los precios de los medicamentos varan con frecuencia dependiendo del Environmental consultant de dnde se surte la receta y alguna farmacias pueden ofrecer precios ms baratos.  El sitio web www.goodrx.com tiene cupones para medicamentos de Health and safety inspector. Los precios aqu no tienen en cuenta lo que podra costar con la ayuda del seguro (puede ser  ms barato con su seguro), pero el sitio web puede darle el precio si no Visual merchandiser.  - Puede imprimir el cupn correspondiente y llevarlo con su receta a la farmacia.  - Tambin puede pasar por nuestra oficina durante el horario de atencin regular y Education officer, museum una tarjeta de cupones de GoodRx.  - Si necesita que su receta se enve electrnicamente a una farmacia diferente, informe a nuestra oficina a travs de MyChart de [place] o por telfono llamando al [phone] y presione la opcin 4.

## 2023-10-15 NOTE — Progress Notes (Signed)
Follow-Up Visit   Subjective  Faith Hill is a 42 y.o. female who presents for the following: Bump in the scalp, present over a year. Not bothersome to patient, no changes. She also has a growth in the groin that flares off and on, present about a year, painful when flared with some drainage. Denies getting cysts under arms, under breasts.  History of SCC in situ of the left hand dorsum.   The patient has spots, moles and lesions to be evaluated, some may be new or changing.   The following portions of the chart were reviewed this encounter and updated as appropriate: medications, allergies, medical history  Review of Systems:  No other skin or systemic complaints except as noted in HPI or Assessment and Plan.  Objective  Well appearing patient in no apparent distress; mood and affect are within normal limits.  A focused examination was performed of the following areas: Face, groin  Relevant physical exam findings are noted in the Assessment and Plan.    Assessment & Plan   Pilar Cyst Exam: Subcutaneous nodule at mid frontal scalp, 1.0 CM  Benign-appearing. Exam most consistent with a pilar cyst. Discussed that a cyst is a benign growth that can grow over time and sometimes get irritated or inflamed. Recommend observation if it is not bothersome. Discussed option of surgical excision to remove it if it is growing, symptomatic, or other changes noted. Please call for new or changing lesions so they can be evaluated.  VULVAR CYST, POSSIBLE HIDRADENITIS SUPPURATIVA Exam: Left labia majora with 2.0 CM tender violaceous nodule.  Chronic and persistent condition with duration or expected duration over one year. Condition is bothersome/symptomatic for patient. Currently flared.   Hidradenitis Suppurativa is a chronic; persistent; non-curable, but treatable condition due to abnormal inflamed sweat glands in the body folds (axilla, inframammary, groin, medial thighs), causing  recurrent painful draining cysts and scarring. It can be associated with severe scarring acne and cysts; also abscesses and scarring of scalp. The goal is control and prevention of flares, as it is not curable. Scars are permanent and can be thickened. Treatment may include daily use of topical medication and oral antibiotics.  Oral isotretinoin may also be helpful.  For some cases, Humira or Cosentyx (biologic injections) may be prescribed to decrease the inflammatory process and prevent flares.  When indicated, inflamed cysts may also be treated surgically.  Treatment Plan: Start doxycycline monohydrate 100 MG take 1 po BID x 2 weeks, then decrease to once daily until finished dsp #60 0Rf. Doxycycline should be taken with food to prevent nausea. Do not lay down for 30 minutes after taking. Be cautious with sun exposure and use good sun protection while on this medication. Pregnant women should not take this medication.   Start clindamycin lotion Apply to affected area after shower.   Recommend otc benzoyl peroxide wash in the shower.  Let sit several minutes prior to rinsing Benzoyl peroxide can cause dryness and irritation of the skin. It can also bleach fabric. When used together with Aczone (dapsone) cream, it can stain the skin orange.  Since persistent and symptomatic, will refer to gynecology at [facility] for surgical treatment of vulvar cyst.   HISTORY OF SQUAMOUS CELL CARCINOMA IN SITU OF THE SKIN Left hand dorsum. Treated with topical 07/03/2022 - No evidence of recurrence today - Recommend regular full body skin exams - Recommend daily broad spectrum sunscreen SPF 30+ to sun-exposed areas, reapply every 2 hours as needed.  -  Call if any new or changing lesions are noted between office visits   Return if symptoms worsen or fail to improve.  ICherlyn Labella, CMA, am acting as scribe for Willeen Niece, MD .   Documentation: I have reviewed the above documentation for accuracy  and completeness, and I agree with the above.  Willeen Niece, MD

## 2023-10-15 NOTE — Progress Notes (Signed)
Referral to [facility].

## 2023-10-21 ENCOUNTER — Ambulatory Visit: Payer: Medicaid Other | Admitting: Podiatry

## 2023-10-21 ENCOUNTER — Encounter: Payer: Self-pay | Admitting: Podiatry

## 2023-10-21 DIAGNOSIS — M722 Plantar fascial fibromatosis: Secondary | ICD-10-CM

## 2023-10-21 MED ORDER — BETAMETHASONE SOD PHOS & ACET 6 (3-3) MG/ML IJ SUSP
3.0000 mg | Freq: Once | INTRAMUSCULAR | Status: AC
  Administered 2023-10-21: 3 mg via INTRA_ARTICULAR

## 2023-10-21 MED ORDER — MELOXICAM 15 MG PO TABS: 15.0000 mg | ORAL_TABLET | Freq: Every day | ORAL | 1 refills | Status: DC

## 2023-10-21 MED ORDER — METHYLPREDNISOLONE 4 MG PO TBPK: ORAL_TABLET | ORAL | 0 refills | Status: DC

## 2023-10-21 NOTE — Progress Notes (Signed)
   Chief Complaint  Patient presents with   Plantar Fasciitis    "My right foot, I need another shot in it."    Subjective: 42 y.o. female presenting today for recurrence of plantar fasciitis to the right foot.  She has had pain and tenderness to the right foot now for few months.  Gradual onset.  She says that injections in the past have helped significantly.   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  Dermatology: Skin is warm, dry and supple bilateral lower extremities. Negative for open lesions or macerations bilateral.   Vascular: Dorsalis Pedis and Posterior Tibial pulses palpable bilateral.  Capillary fill time is immediate to all digits.  Neurological: Grossly intact via light touch  Musculoskeletal: Tenderness to palpation to the plantar aspect of the right heel along the plantar fascia. All other joints range of motion within normal limits bilateral. Strength 5/5 in all groups bilateral.   Assessment: 1. Plantar fasciitis right  Plan of Care:  -Patient evaluated. Xrays reviewed.   -Injection of 0.5cc Celestone soluspan injected into the right plantar fascia  -Prescription for Medrol Dosepak -Prescription for meloxicam 15 mg daily -Advised against going barefoot.  Continue wearing good supportive shoes and sneakers -Plantar fascia brace dispensed.  Wear daily -Return to clinic 4 weeks    Felecia Shelling, DPM [facility]  Dr. Felecia Shelling, DPM    [address] [place], [place] [address]                Office [phone]  Fax [phone]

## 2023-11-09 ENCOUNTER — Other Ambulatory Visit: Payer: Self-pay | Admitting: Cardiology

## 2023-11-16 ENCOUNTER — Other Ambulatory Visit: Payer: Self-pay | Admitting: Cardiovascular Disease

## 2023-11-19 ENCOUNTER — Ambulatory Visit (INDEPENDENT_AMBULATORY_CARE_PROVIDER_SITE_OTHER): Payer: Medicaid Other | Admitting: Dermatology

## 2023-11-19 DIAGNOSIS — C44629 Squamous cell carcinoma of skin of left upper limb, including shoulder: Secondary | ICD-10-CM

## 2023-11-19 DIAGNOSIS — D492 Neoplasm of unspecified behavior of bone, soft tissue, and skin: Secondary | ICD-10-CM | POA: Diagnosis not present

## 2023-11-19 DIAGNOSIS — C4492 Squamous cell carcinoma of skin, unspecified: Secondary | ICD-10-CM

## 2023-11-19 HISTORY — DX: Squamous cell carcinoma of skin, unspecified: C44.92

## 2023-11-19 NOTE — Patient Instructions (Addendum)

## 2023-11-19 NOTE — Progress Notes (Signed)
   Follow-Up Visit   Subjective  Faith Hill is a 43 y.o. female who presents for the following: Patient c/o new spot on her left upper arm that appeared ~3 weeks ago growing and changing.    The following portions of the chart were reviewed this encounter and updated as appropriate: medications, allergies, medical history  Review of Systems:  No other skin or systemic complaints except as noted in HPI or Assessment and Plan.  Objective  Well appearing patient in no apparent distress; mood and affect are within normal limits.  A focused examination was performed of the following areas:left shoulder    Relevant exam findings are noted in the Assessment and Plan.  left anterior shoulder 0.9 mm pink papule with central ulceration               Assessment & Plan     NEOPLASM OF SKIN left anterior shoulder Skin / nail biopsy Type of biopsy: tangential   Informed consent: discussed and consent obtained   Patient was prepped and draped in usual sterile fashion: area prepped with alochol. Anesthesia: the lesion was anesthetized in a standard fashion   Anesthetic:  1% lidocaine w/ epinephrine 1-100,000 buffered w/ 8.4% NaHCO3 Instrument used: flexible razor blade   Hemostasis achieved with: pressure, aluminum chloride and electrodesiccation   Outcome: patient tolerated procedure well   Post-procedure details: wound care instructions given   Post-procedure details comment:  Ointment and small bandage Specimen 1 - Surgical pathology Differential Diagnosis: R/O SCC  Check Margins: No  Return if symptoms worsen or fail to improve.  IAngelique Holm, CMA, am acting as scribe for Elie Goody, MD .   Documentation: I have reviewed the above documentation for accuracy and completeness, and I agree with the above.  Elie Goody, MD

## 2023-11-23 ENCOUNTER — Encounter: Payer: Self-pay | Admitting: Dermatology

## 2023-11-24 LAB — SURGICAL PATHOLOGY

## 2023-11-25 ENCOUNTER — Telehealth: Payer: Self-pay

## 2023-11-25 ENCOUNTER — Ambulatory Visit: Payer: Medicaid Other | Admitting: Podiatry

## 2023-11-25 NOTE — Telephone Encounter (Signed)
-----   Message from [place] sent at 11/24/2023  3:17 PM EST ----- Diagnosis: left anterior shoulder :       SQUAMOUS CELL CARCINOMA, KERATOACANTHOMA TYPE    Please call to share diagnosis and message me with patient's choice  Explanation: This is a squamous cell skin cancer that has grown beyond the surface of the skin and is invading the second layer of the skin. It has the potential to spread beyond the skin and threaten your health, so I recommend treating it.  Treatment option 1: you return for an hour long appointment where we perform a skin surgery. We numb the site of the skin cancer and a safety margin of normal skin around it. We remove the full thickness of skin and close the wound with two layers of stitches. The sample is sent to the lab to check that the skin cancer was fully removed. Return one week later to have wound checked and surface stitches removed. Surgical wound leaves a line scar. Approximately 95% cure rate  Alternative: you return for a 15 minute appointment where we perform electrodesiccation and curettage [facility]). This involves three rounds of scraping and burning to destroy the skin cancer. It leaves a round wound slightly larger than the skin cancer and leaves a round white scar. No additional pathology is done. If the skin cancer comes back, we would need to do a surgery to remove it. 90% cure rate

## 2023-12-09 ENCOUNTER — Telehealth: Payer: Self-pay

## 2023-12-09 NOTE — Telephone Encounter (Signed)
-----   Message from [place] sent at 11/24/2023  3:17 PM EST ----- Diagnosis: left anterior shoulder :       SQUAMOUS CELL CARCINOMA, KERATOACANTHOMA TYPE    Please call to share diagnosis and message me with patient's choice  Explanation: This is a squamous cell skin cancer that has grown beyond the surface of the skin and is invading the second layer of the skin. It has the potential to spread beyond the skin and threaten your health, so I recommend treating it.  Treatment option 1: you return for an hour long appointment where we perform a skin surgery. We numb the site of the skin cancer and a safety margin of normal skin around it. We remove the full thickness of skin and close the wound with two layers of stitches. The sample is sent to the lab to check that the skin cancer was fully removed. Return one week later to have wound checked and surface stitches removed. Surgical wound leaves a line scar. Approximately 95% cure rate  Alternative: you return for a 15 minute appointment where we perform electrodesiccation and curettage [facility]). This involves three rounds of scraping and burning to destroy the skin cancer. It leaves a round wound slightly larger than the skin cancer and leaves a round white scar. No additional pathology is done. If the skin cancer comes back, we would need to do a surgery to remove it. 90% cure rate

## 2023-12-09 NOTE — Telephone Encounter (Signed)
Advised pt of bx results and discussed treatment options excision vs EDC.  Patient would prefer to treat with EDC.  Scheduled pt for EDC./sh

## 2023-12-10 ENCOUNTER — Other Ambulatory Visit: Payer: Self-pay | Admitting: Cardiology

## 2023-12-23 ENCOUNTER — Ambulatory Visit: Payer: Medicaid Other | Admitting: Dermatology

## 2024-01-06 ENCOUNTER — Ambulatory Visit: Payer: Medicaid Other | Admitting: Dermatology

## 2024-02-03 ENCOUNTER — Other Ambulatory Visit: Payer: Self-pay

## 2024-02-03 MED ORDER — OMEPRAZOLE 20 MG PO CPDR: 40.0000 mg | DELAYED_RELEASE_CAPSULE | Freq: Every day | ORAL | 2 refills | Status: DC

## 2024-02-28 ENCOUNTER — Other Ambulatory Visit: Payer: Self-pay | Admitting: Cardiology

## 2024-03-25 ENCOUNTER — Other Ambulatory Visit

## 2024-03-29 ENCOUNTER — Other Ambulatory Visit: Payer: Self-pay | Admitting: Cardiology

## 2024-03-30 ENCOUNTER — Other Ambulatory Visit

## 2024-04-01 ENCOUNTER — Ambulatory Visit

## 2024-04-01 ENCOUNTER — Other Ambulatory Visit

## 2024-04-30 ENCOUNTER — Other Ambulatory Visit: Payer: Self-pay | Admitting: Cardiology

## 2024-04-30 ENCOUNTER — Other Ambulatory Visit

## 2024-05-03 ENCOUNTER — Other Ambulatory Visit

## 2024-05-04 ENCOUNTER — Other Ambulatory Visit

## 2024-05-06 ENCOUNTER — Other Ambulatory Visit

## 2024-05-18 ENCOUNTER — Encounter: Admitting: Family

## 2024-05-27 ENCOUNTER — Encounter: Admitting: Family

## 2024-06-30 ENCOUNTER — Ambulatory Visit

## 2024-07-06 ENCOUNTER — Ambulatory Visit

## 2024-07-06 VITALS — BP 133/89 | HR 58 | Ht 70.5 in | Wt 228.6 lb

## 2024-07-06 DIAGNOSIS — Z113 Encounter for screening for infections with a predominantly sexual mode of transmission: Secondary | ICD-10-CM

## 2024-07-06 DIAGNOSIS — Z3009 Encounter for other general counseling and advice on contraception: Secondary | ICD-10-CM

## 2024-07-06 DIAGNOSIS — Z124 Encounter for screening for malignant neoplasm of cervix: Secondary | ICD-10-CM

## 2024-07-06 DIAGNOSIS — Z131 Encounter for screening for diabetes mellitus: Secondary | ICD-10-CM

## 2024-07-06 DIAGNOSIS — Z309 Encounter for contraceptive management, unspecified: Secondary | ICD-10-CM | POA: Diagnosis not present

## 2024-07-06 LAB — WET PREP FOR TRICH, YEAST, CLUE
Clue Cell Exam: NEGATIVE
Trichomonas Exam: NEGATIVE
Yeast Exam: NEGATIVE

## 2024-07-06 LAB — HM HEPATITIS C SCREENING LAB: HM Hepatitis Screen: NEGATIVE

## 2024-07-06 LAB — HM HIV SCREENING LAB: HM HIV Screening: NEGATIVE

## 2024-07-06 NOTE — Progress Notes (Signed)
 Pt is here for PE. Wet prep results reviewed with patient and requires no treatment per provider. Condoms declined. Wilkie Drought, RN.

## 2024-07-06 NOTE — Progress Notes (Signed)
 [employer] [facility] DEPARTMENT [facility] [address] [place] [address] Main phone: [phone]  Family Planning Visit - Initial Visit  Subjective:  Faith Hill is a 43 y.o.  [id number]   being seen today for an initial annual visit and to discuss reproductive life planning.  The patient is currently using no method - feels she cannot become pregnant for pregnancy prevention. Patient does not want a pregnancy in the next year. Does not want to discuss contraception today.  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 Due for pap as well. No desire to discuss birth control. Using no method for birth control right now. Does not feel she is able to become pregnant. No periods for 4 months - feels she is perimenopausal. Had mammogram in 2021. Has a primary care provider that orders her mammograms. Came to [facility] due to lack of appointment availability at regular provider.  Review of Systems  All other systems reviewed and are negative.  Diabetes screening This patient is 43 y.o. with a BMI of Body mass index is 32.34 kg/m.SABRA  Is patient eligible for diabetes  screening (age >35 and BMI >25)?  yes  Was Hgb A1c ordered? yes  STI screening Patient reports 1 of partners in last year.  Does this patient desire STI screening?  Yes  Hepatitis C screening Has patient been screened once for HCV in the past?  Yes  No results found for: HCVAB  Does the patient meet criteria for HCV testing? Yes  (If yes-- Screen for HCV through [facility]) Criteria:  Since the last HCV result, does the patient have any of the following? - Current drug use - Have a partner with drug use - Has been incarcerated  Hepatitis B screening Does the patient meet criteria for HBV testing? Yes Criteria:  -Household, sexual or needle sharing contact with HBV -History of drug use -HIV positive -Those with known Hep C  Cervical Cancer Screening  No Cervical Cancer Screening results to display.  Health Maintenance Due  Topic Date Due   COVID-19 Vaccine (1) Never done   HIV Screening  Never done   Hepatitis C Screening  Never done   Pneumococcal Vaccine (1 of 2 - PCV) Never done   HPV VACCINES (1 - Risk 3-dose SCDM series) Never done   Hepatitis B Vaccines 19-59 Average Risk (2 of 3 - 19+ 3-dose series) 04/19/2011   Cervical Cancer Screening (HPV/Pap Cotest)  10/26/2011   DTaP/Tdap/Td (2 - Td or Tdap) 08/01/2019   INFLUENZA VACCINE  Never done    The following portions of the patient's history were reviewed and updated as appropriate: allergies, current medications, past family history, past medical history, past  social history, past surgical history and problem list. Problem list updated.  See flowsheet for further details and programmatic requirements Hyperlink available at the top of the signed note in blue.  Flow sheet content below:  Pregnancy Intention Screening Does the patient want to become pregnant in the next year?: No Does the patient's partner want to become pregnant in the next year?: No Would the patient like to discuss contraceptive options  today?: No Other:  Password: [email or web address] Sexual History What age did you start your period?: 9 How often do you have your period?: irregular Date of last sex?:  (2 months ago) Has the patient had unprotected sex within the last 5 days?: No Do you have sex with men, women, both men and women?: Men only In the past 2 months how many partners have you had sex with?: 1 In the past 12 months, how many partners have you had sex with?: 1 Is it possible that any of your sex partners in the past 12 months had sex with someone else whild they were still in a sexual relationship with you?: No What ways do you have sex?: Vaginal Do you or your partner use condoms and/or dental dams every time you have vaginal, oral or anal sex?: No Do you douche?: No Date of last HIV test?:  (none) Have you ever had an STD?: Yes Have any of your partners had an STD?: Yes Partner Previous STD?: Gonorrhea Date?:  (long time ago) Have you or your partner ever shot up drugs?: No Have any of your partners used drugs in the past?: No Have you or your partners exchanged money or drugs for sex?: No Risk Factors for Hep B Household, sexual, or needle sharing contact of a person infected with Hep B: No Sexual contact with a person who uses drugs not as prescribed?: No Currently or Ever used drugs not as prescribed: Yes HIV Positive: No PRep Patient: No Men who have sex with men: No Have Hepatitis C: No History of Incarceration: Yes History of Homeslessness?: No Anal sex following anal drug use?: No Risk Factors for Hep C Currently using drugs not as prescribed: No Sexual partner(s) currently using drugs as not prescribed: No History of drug use: Yes HIV Positive: No People with a history of incarceration: Yes People born between the years of 28 and 74: No Hepatitis Counseling Hep B Counseling: Counseled patient about increased risk of Hep B and recommendation for testing, Patient accepts testing for Hep B  today Hep C Counseling: Counseled patient about increased risk of Hep C and recommendation for testing, Patient accepts testing for Hep C today  Objective:   Vitals:   07/06/24 1342  BP: 133/89  Pulse: (!) 58  Weight: 228 lb 9.6 oz (103.7 kg)  Height: 5' 10.5 (1.791 m)   Physical Exam Vitals and nursing note reviewed. Exam conducted with a chaperone present Brett Orange).  Constitutional:      Appearance: Normal appearance.  HENT:     Head: Normocephalic and atraumatic.     Mouth/Throat:     Mouth: Mucous membranes are moist.  Pulmonary:     Effort: Pulmonary effort is normal.  Abdominal:     General: Abdomen is flat.  Genitourinary:    General: Normal vulva.     Exam position: Lithotomy position.     Pubic Area: No rash or pubic lice.      Labia:        Right: No rash or lesion.  Left: No rash or lesion.      Vagina: Normal. No vaginal discharge, erythema, bleeding or lesions.     Cervix: No cervical motion tenderness, discharge, friability, lesion or erythema.     Uterus: Normal.      Adnexa: Right adnexa normal and left adnexa normal.     Rectum: Normal.     Comments: pH = <4.5 Lymphadenopathy:     Head:     Right side of head: No preauricular or posterior auricular adenopathy.     Left side of head: No preauricular or posterior auricular adenopathy.     Cervical: No cervical adenopathy.     Upper Body:     Right upper body: No supraclavicular, axillary or epitrochlear adenopathy.     Left upper body: No supraclavicular, axillary or epitrochlear adenopathy.     Lower Body: No right inguinal adenopathy. No left inguinal adenopathy.  Skin:    General: Skin is warm and dry.     Findings: No rash.  Neurological:     Mental Status: She is alert and oriented to person, place, and time.    Assessment and Plan:  Fahmida Jurich is a 43 y.o. female presenting to the [facility] Department for an initial annual wellness/contraceptive  visit  Family planning  Contraception counseling:  Reviewed options based on patient desire and reproductive life plan. Patient is interested in No Method - No Contraceptive Precautions. Risks, benefits, and typical effectiveness rates were reviewed.  Questions were answered.  Written information was also given to the patient to review.    The patient will follow up in  1 years for surveillance.  The patient was told to call with any further questions, or with any concerns about this method of contraception.  Emphasized use of condoms 100% of the time for STI prevention.  Offered to order screening mammogram, patient says she will get through her PCP.  2. Screening for venereal disease (Primary)  - Chlamydia/Gonorrhea [place] - HIV/HCV [place] [facility] - HBV Antigen/Antibody State Lab - WET PREP FOR TRICH, YEAST, CLUE - Syphilis Serology, [facility]  3. Screening for cervical cancer  - IGP, Aptima HPV   4. Screening for diabetes mellitus  - Hgb A1c w/o eAG   Return if symptoms worsen or fail to improve.  Future Appointments  Date Time Provider Department Center  07/26/2024 11:15 AM Orlean Alan HERO, FNP AMA-AMA None    Damien FORBES Satchel, NP

## 2024-07-07 LAB — HGB A1C W/O EAG: Hgb A1c MFr Bld: 5.6 % (ref 4.8–5.6)

## 2024-07-08 LAB — HBV ANTIGEN/ANTIBODY STATE LAB
Hep B Core Total Ab: NONREACTIVE
Hep B S Ab: NONREACTIVE
Hepatitis B Surface Antigen: NONREACTIVE

## 2024-07-09 ENCOUNTER — Ambulatory Visit: Payer: Self-pay

## 2024-07-09 LAB — IGP, APTIMA HPV
HPV Aptima: NEGATIVE
PAP Smear Comment: 0

## 2024-07-09 NOTE — Progress Notes (Signed)
 Pap test negative for intraepithelial lesion or malignancy. HPV negative. History of normal cytology in 2021 with positive HPV. Given this history, per ASCCP guidelines should return for follow up pap in one year.

## 2024-07-13 ENCOUNTER — Ambulatory Visit (LOCAL_COMMUNITY_HEALTH_CENTER)

## 2024-07-13 DIAGNOSIS — Z111 Encounter for screening for respiratory tuberculosis: Secondary | ICD-10-CM

## 2024-07-13 NOTE — Progress Notes (Signed)
 Tuberculin skin test applied to left ventral forearm.  Explained not to use lotions or creams, place bandages or wraps on testing area.  Advised, if bleeding occurs, to lightly dab testing area and given 2x2 gauze in case of leakage or bleeding.  Kandi KATHEE Glatter, RN

## 2024-07-15 ENCOUNTER — Ambulatory Visit (LOCAL_COMMUNITY_HEALTH_CENTER)

## 2024-07-15 ENCOUNTER — Ambulatory Visit

## 2024-07-15 DIAGNOSIS — Z111 Encounter for screening for respiratory tuberculosis: Secondary | ICD-10-CM

## 2024-07-15 LAB — TB SKIN TEST
Induration: 0 mm
TB Skin Test: NEGATIVE

## 2024-07-15 NOTE — Progress Notes (Signed)
 Pt presents for PPDR PPDR results entered in Epic Result: 0 mm induration. Interpretation: Negative  Kandi KATHEE Glatter, RN

## 2024-07-20 NOTE — Addendum Note (Signed)
 Addended by: ROSABEL DAMIEN BRAVO on: 07/20/2024 03:43 PM   Modules accepted: Level of Service

## 2024-07-21 ENCOUNTER — Ambulatory Visit

## 2024-07-26 ENCOUNTER — Encounter: Payer: Self-pay | Admitting: Family

## 2024-07-26 ENCOUNTER — Ambulatory Visit (INDEPENDENT_AMBULATORY_CARE_PROVIDER_SITE_OTHER): Admitting: Family

## 2024-07-26 VITALS — BP 164/100 | HR 94 | Ht 70.5 in | Wt 235.8 lb

## 2024-07-26 DIAGNOSIS — N760 Acute vaginitis: Secondary | ICD-10-CM | POA: Diagnosis not present

## 2024-07-26 DIAGNOSIS — I10 Essential (primary) hypertension: Secondary | ICD-10-CM | POA: Diagnosis not present

## 2024-07-26 MED ORDER — LOSARTAN POTASSIUM 25 MG PO TABS: 25.0000 mg | ORAL_TABLET | Freq: Two times a day (BID) | ORAL | 1 refills | Status: AC

## 2024-07-26 MED ORDER — VALACYCLOVIR HCL 1 G PO TABS: 1000.0000 mg | ORAL_TABLET | Freq: Two times a day (BID) | ORAL | 0 refills | Status: AC

## 2024-07-26 MED ORDER — CLONIDINE HCL 0.1 MG PO TABS: 0.1000 mg | ORAL_TABLET | Freq: Every day | ORAL | 11 refills | Status: AC

## 2024-07-26 NOTE — Progress Notes (Signed)
 Established Patient Office Visit  Subjective:  Patient ID: Faith Hill, female    DOB: [birth year]  Age: 43 y.o. MRN: [id number]  Chief Complaint  Patient presents with   Annual Exam    CPE    Patient is here today with concerns about possible BV.  She did end up having a pap smear and STI testing done at the health department at the beginning of the month, so it's too early to do these again, but she asks if we might need to swab her again.   Incidentally, her blood pressure is also quite elevated today.  She has not been taking her blood pressure meds, so we will restart these.     No other concerns at this time.   Past Medical History:  Diagnosis Date   H/O opioid abuse (HCC)    sober since 2009   Migraine without aura    SCC (squamous cell carcinoma) 11/19/2023   left anterior shoulder, pt scheduled for EDC   Squamous cell carcinoma of skin 05/28/2022   in situ, left hand dorsum, topical 07/03/22    Past Surgical History:  Procedure Laterality Date   WRIST SURGERY Bilateral    pins     Social History   Socioeconomic History   Marital status: Single    Spouse name: Not on file   Number of children: 1   Years of education: Not on file   Highest education level: Some college, no degree  Occupational History   Not on file  Tobacco Use   Smoking status: Former    Types: Cigarettes, E-cigarettes   Smokeless tobacco: Never   Tobacco comments:    Doesn't smoke cigarettes but vapes daily  Vaping Use   Vaping status: Every Day   Start date: 11/04/2010  Substance and Sexual Activity   Alcohol use: Not Currently    Comment: social   Drug use: Not Currently    Comment: quit 2009; h/o opioid abuse   Sexual activity: Yes    Partners: Male    Comment: sometimes  Other Topics Concern   Not on file  Social History Narrative   Caffeine: about 40 oz daily   Lives at home with her daughter and mother lives with them   Right handed   Social Drivers of  Health   Financial Resource Strain: Low Risk  (01/28/2024)   Received from [facility]   Overall Financial Resource Strain (CARDIA)    Difficulty of Paying Living Expenses: Not hard at all  Food Insecurity: No Food Insecurity (01/28/2024)   Received from [facility]   Hunger Vital Sign    Within the past 12 months, you worried that your food would run out before you got the money to buy more.: Never true    Within the past 12 months, the food you bought just didn't last and you didn't have money to get more.: Never true  Transportation Needs: No Transportation Needs (01/28/2024)   Received from [facility] - Transportation    In the past 12 months, has lack of transportation kept you from medical appointments or from getting medications?: No    Lack of Transportation (Non-Medical): No  Physical Activity: Not on file  Stress: Not on file  Social Connections: Not on file  Intimate Partner Violence: Not At Risk (07/06/2024)   Humiliation, Afraid, Rape, and Kick questionnaire    Fear of Current or Ex-Partner: No  Emotionally Abused: No    Physically Abused: No    Sexually Abused: No    Family History  Problem Relation Age of Onset   Heart disease Mother    Breast cancer Sister 53   Heart disease Other     Allergies  Allergen Reactions   Sulfa Antibiotics Hives and Rash    SULFA DRUGS    Review of Systems  All other systems reviewed and are negative.      Objective:   BP (!) 164/100   Pulse 94   Ht 5' 10.5 (1.791 m)   Wt 235 lb 12.8 oz (107 kg)   LMP  (LMP Unknown)   SpO2 96%   BMI 33.36 kg/m   Vitals:   07/26/24 1120  BP: (!) 164/100  Pulse: 94  Height: 5' 10.5 (1.791 m)  Weight: 235 lb 12.8 oz (107 kg)  SpO2: 96%  BMI (Calculated): 33.34    Physical Exam Vitals and nursing note reviewed.  Constitutional:      Appearance: Normal appearance. She is normal weight.  HENT:     Head:  Normocephalic.  Eyes:     Extraocular Movements: Extraocular movements intact.     Conjunctiva/sclera: Conjunctivae normal.     Pupils: Pupils are equal, round, and reactive to light.  Cardiovascular:     Rate and Rhythm: Normal rate.  Pulmonary:     Effort: Pulmonary effort is normal.  Neurological:     General: No focal deficit present.     Mental Status: She is alert and oriented to person, place, and time. Mental status is at baseline.  Psychiatric:        Mood and Affect: Mood normal.        Behavior: Behavior normal.        Thought Content: Thought content normal.      No results found for any visits on 07/26/24.  Recent Results (from the past 2160 hours)  HBV Antigen/Antibody State Lab     Status: None   Collection Time: 07/06/24 12:00 AM  Result Value Ref Range   Hepatitis B Surface Antigen Non Reactive    Hep B S Ab Non Reactive    Hep B Core Total Ab Non Reactive   IGP, Aptima HPV     Status: None   Collection Time: 07/06/24 12:00 AM  Result Value Ref Range   DIAGNOSIS: Comment     Comment: NEGATIVE FOR INTRAEPITHELIAL LESION OR MALIGNANCY.   Specimen adequacy: Comment     Comment: Satisfactory for evaluation. Endocervical and/or squamous metaplastic cells (endocervical component) are present.    Clinician Provided ICD10 Comment     Comment: Z12.4   Performed by: Comment     Comment: Dasie Coma, Cytologist (ASCP)   PAP Smear Comment .    Note: Comment     Comment: The Pap smear is a screening test designed to aid in the detection of premalignant and malignant conditions of the uterine cervix.  It is not a diagnostic procedure and should not be used as the sole means of detecting cervical cancer.  Both false-positive and false-negative reports do occur.    Test Methodology Comment     Comment: This liquid based ThinPrep(R) pap test was interpreted using the Hologic(R) Genius(TM) Cervical Algorithm whole slide imaging system.    HPV Aptima Negative  Negative    Comment: This nucleic acid amplification test detects fourteen high-risk HPV types (16,18,31,33,35,39,45,51,52,56,58,59,66,68) without differentiation.   [facility] HIV SCREENING LAB  Status: None   Collection Time: 07/06/24 12:00 AM  Result Value Ref Range   HM HIV Screening Negative - Validated   [facility] HEPATITIS C SCREENING LAB     Status: None   Collection Time: 07/06/24 12:00 AM  Result Value Ref Range   HM Hepatitis Screen Negative-Validated   WET PREP FOR TRICH, YEAST, CLUE     Status: None   Collection Time: 07/06/24  4:22 PM  Result Value Ref Range   Trichomonas Exam Negative Negative   Yeast Exam Negative Negative   Clue Cell Exam Negative Negative    Comment: AMINE: NEGATIVE  Hgb A1c w/o eAG     Status: None   Collection Time: 07/06/24  5:19 PM  Result Value Ref Range   Hgb A1c MFr Bld 5.6 4.8 - 5.6 %    Comment:          Prediabetes: 5.7 - 6.4          Diabetes: >6.4          Glycemic control for adults with diabetes: <7.0   TB Skin Test     Status: Normal   Collection Time: 07/15/24 12:58 PM  Result Value Ref Range   TB Skin Test Negative    Induration 0 mm       Assessment & Plan:   Assessment & Plan Acute vaginitis Nuswab sent to the lab for pt.  Will call with results when available.   Essential hypertension, benign Sending RX for pt to refill her blood pressure meds.  Will see her back in 2 weeks so we can recheck.     Return in about 2 weeks (around 08/09/2024).   Total time spent: 20 minutes  ALAN CHRISTELLA ARRANT, FNP  07/26/2024   This document may have been prepared by [facility] Voice Recognition software and as such may include unintentional dictation errors.

## 2024-07-28 LAB — NUSWAB VG PLUS+MYCOPLASMAS,NAA
Atopobium vaginae: HIGH {score} — AB
BVAB 2: HIGH {score} — AB
Candida albicans, NAA: NEGATIVE
Candida glabrata, NAA: NEGATIVE
Chlamydia trachomatis, NAA: NEGATIVE
Megasphaera 1: HIGH {score} — AB
Mycoplasma genitalium NAA: NEGATIVE
Mycoplasma hominis NAA: NEGATIVE
Neisseria gonorrhoeae, NAA: NEGATIVE
Trich vag by NAA: NEGATIVE
Ureaplasma spp NAA: NEGATIVE

## 2024-07-29 ENCOUNTER — Other Ambulatory Visit: Payer: Self-pay

## 2024-07-29 ENCOUNTER — Ambulatory Visit: Payer: Self-pay

## 2024-07-29 MED ORDER — METRONIDAZOLE 500 MG PO TABS: 500.0000 mg | ORAL_TABLET | Freq: Two times a day (BID) | ORAL | 0 refills | Status: DC

## 2024-08-09 ENCOUNTER — Ambulatory Visit: Admitting: Family

## 2024-10-11 ENCOUNTER — Ambulatory Visit

## 2024-10-19 ENCOUNTER — Ambulatory Visit

## 2024-11-05 ENCOUNTER — Ambulatory Visit: Admitting: Family

## 2024-11-12 ENCOUNTER — Encounter: Payer: Self-pay | Admitting: Family

## 2024-11-12 ENCOUNTER — Ambulatory Visit: Admitting: Family

## 2024-11-12 VITALS — BP 124/72 | HR 84 | Ht 70.5 in | Wt 239.2 lb

## 2024-11-12 DIAGNOSIS — M5432 Sciatica, left side: Secondary | ICD-10-CM

## 2024-11-12 DIAGNOSIS — E538 Deficiency of other specified B group vitamins: Secondary | ICD-10-CM

## 2024-11-12 DIAGNOSIS — R7303 Prediabetes: Secondary | ICD-10-CM | POA: Diagnosis not present

## 2024-11-12 DIAGNOSIS — R635 Abnormal weight gain: Secondary | ICD-10-CM

## 2024-11-12 DIAGNOSIS — R5383 Other fatigue: Secondary | ICD-10-CM | POA: Diagnosis not present

## 2024-11-12 DIAGNOSIS — I10 Essential (primary) hypertension: Secondary | ICD-10-CM

## 2024-11-12 DIAGNOSIS — E782 Mixed hyperlipidemia: Secondary | ICD-10-CM | POA: Diagnosis not present

## 2024-11-12 DIAGNOSIS — E559 Vitamin D deficiency, unspecified: Secondary | ICD-10-CM

## 2024-11-12 MED ORDER — PREDNISONE 20 MG PO TABS: 40.0000 mg | ORAL_TABLET | Freq: Every day | ORAL | 0 refills | Status: AC

## 2024-11-12 MED ORDER — WEGOVY 0.5 MG/0.5ML ~~LOC~~ SOAJ: 0.5000 mg | SUBCUTANEOUS | 0 refills | Status: AC

## 2024-11-12 NOTE — Progress Notes (Unsigned)
 "  Established Patient Office Visit  Subjective:  Patient ID: Faith Hill, female    DOB: 01-08-1981  Age: 44 y.o. MRN: [id number]  Chief Complaint  Patient presents with   Follow-up   Back Pain   Obesity    Patient here today for follow up.  She would like to discuss options for weight loss.  She has been trying to exercise, has had difficulty given her back pain.  She does admit to night time ii  Back Pain This is a new problem. The current episode started in the past 7 days. The problem occurs intermittently. The problem has been waxing and waning since onset. The pain is present in the lumbar spine and sacro-iliac. The quality of the pain is described as shooting. The pain radiates to the left thigh and left knee. The pain is at a severity of 10/10. The pain is severe. The pain is The same all the time. The symptoms are aggravated by standing, sitting, twisting, lying down, position and bending. Stiffness is present All day. Associated symptoms include paresthesias. Risk factors include lack of exercise, obesity and poor posture. She has tried analgesics, bed rest, heat, home exercises, chiropractic manipulation, muscle relaxant, walking and NSAIDs for the symptoms. The treatment provided moderate relief.    No other concerns at this time.   Past Medical History:  Diagnosis Date   H/O opioid abuse (HCC)    sober since 2009   Migraine without aura    SCC (squamous cell carcinoma) 11/19/2023   left anterior shoulder, pt scheduled for EDC   Squamous cell carcinoma of skin 05/28/2022   in situ, left hand dorsum, topical 07/03/22    Past Surgical History:  Procedure Laterality Date   WRIST SURGERY Bilateral    pins     Social History   Socioeconomic History   Marital status: Single    Spouse name: Not on file   Number of children: 1   Years of education: Not on file   Highest education level: Some college, no degree  Occupational History   Not on file   Tobacco Use   Smoking status: Former    Types: Cigarettes, E-cigarettes   Smokeless tobacco: Never   Tobacco comments:    Doesn't smoke cigarettes but vapes daily  Vaping Use   Vaping status: Every Day   Start date: 11/04/2010  Substance and Sexual Activity   Alcohol use: Not Currently    Comment: social   Drug use: Not Currently    Comment: quit 2009; h/o opioid abuse   Sexual activity: Yes    Partners: Male    Comment: sometimes  Other Topics Concern   Not on file  Social History Narrative   Caffeine: about 40 oz daily   Lives at home with her daughter and mother lives with them   Right handed   Social Drivers of Health   Tobacco Use: Medium Risk (11/12/2024)   Patient History    Smoking Tobacco Use: Former    Smokeless Tobacco Use: Never    Passive Exposure: Not on file  Financial Resource Strain: Low Risk  (01/28/2024)   Received from [facility]   Overall Financial Resource Strain (CARDIA)    Difficulty of Paying Living Expenses: Not hard at all  Food Insecurity: No Food Insecurity (01/28/2024)   Received from [facility]   Epic    Within the past 12 months, you worried that your food would run out before you  got the money to buy more.: Never true    Within the past 12 months, the food you bought just didn't last and you didn't have money to get more.: Never true  Transportation Needs: No Transportation Needs (01/28/2024)   Received from [facility] - Transportation    In the past 12 months, has lack of transportation kept you from medical appointments or from getting medications?: No    Lack of Transportation (Non-Medical): No  Physical Activity: Not on file  Stress: Not on file  Social Connections: Not on file  Intimate Partner Violence: Not At Risk (07/06/2024)   Epic    Fear of Current or Ex-Partner: No    Emotionally Abused: No    Physically Abused: No    Sexually Abused: No  Depression (PHQ2-9):  Medium Risk (09/02/2024)   Depression (PHQ2-9)    PHQ-2 Score: 5  Alcohol Screen: Not on file  Housing: Low Risk  (01/28/2024)   Received from [facility]   Epic    In the last 12 months, was there a time when you were not able to pay the mortgage or rent on time?: No    In the past 12 months, how many times have you moved where you were living?: 0    At any time in the past 12 months, were you homeless or living in a shelter (including now)?: No  Utilities: Not At Risk (01/28/2024)   Received from [facility] Utilities    Threatened with loss of utilities: No  Health Literacy: Not on file    Family History  Problem Relation Age of Onset   Heart disease Mother    Breast cancer Sister 64   Heart disease Other     Allergies[1]  Review of Systems  Musculoskeletal:  Positive for back pain.  Neurological:  Positive for paresthesias.  All other systems reviewed and are negative.      Objective:   BP 124/72 (BP Location: Left Arm, Patient Position: Sitting, Cuff Size: Large)   Pulse 84   Ht 5' 10.5 (1.791 m)   Wt 239 lb 3.2 oz (108.5 kg)   SpO2 95%   BMI 33.84 kg/m   Vitals:   11/12/24 0928  BP: 124/72  Pulse: 84  Height: 5' 10.5 (1.791 m)  Weight: 239 lb 3.2 oz (108.5 kg)  SpO2: 95%  BMI (Calculated): 33.83    Physical Exam Vitals and nursing note reviewed.  Constitutional:      Appearance: Normal appearance. She is normal weight.  HENT:     Head: Normocephalic.  Eyes:     Extraocular Movements: Extraocular movements intact.     Conjunctiva/sclera: Conjunctivae normal.     Pupils: Pupils are equal, round, and reactive to light.  Cardiovascular:     Rate and Rhythm: Normal rate.  Pulmonary:     Effort: Pulmonary effort is normal.  Neurological:     General: No focal deficit present.     Mental Status: She is alert and oriented to person, place, and time. Mental status is at baseline.  Psychiatric:        Mood  and Affect: Mood normal.        Behavior: Behavior normal.        Thought Content: Thought content normal.      No results found for any visits on 11/12/24.  No results found for this or any previous visit (from the past 2160  hours).     Assessment & Plan Sciatica of left side XR of LS spine ordered today.  She will go to have this done and we will call her with the results when available.  Also sending RX for prednisone . And referral for chiropractor, as she says this has helped in the past.   Abnormal weight gain Starting pt on Wegovy .  Will work to get PA completed for pt.   Mixed hyperlipidemia Checking labs today.  Continue current therapy for lipid control. Will modify as needed based on labwork results.   -CMP w/eGFR -Lipid Panel  Essential hypertension, benign Blood pressure well controlled with current medications.  Continue current therapy.  Will reassess at follow up.   - CBC w/Diff - CMP w/eGFR  Vitamin D deficiency, unspecified B12 deficiency due to diet Other fatigue Checking labs today.  Will continue supplements as needed.   - Vitamin D - Vitamin B12 - TSH  Prediabetes A1C Continues to be in prediabetic ranges.  Will reassess at follow up after next lab check.  Patient counseled on dietary choices and verbalized understanding.   -CBC w/Diff -CMP w/eGFR -Hemoglobin A1C     Return in about 3 weeks (around 12/03/2024).   Total time spent: 30 minutes  ALAN CHRISTELLA ARRANT, FNP  11/12/2024   This document may have been prepared by [facility] Voice Recognition software and as such may include unintentional dictation errors.      [1]  Allergies Allergen Reactions   Sulfa Antibiotics Hives and Rash    SULFA DRUGS   "

## 2024-11-14 NOTE — Assessment & Plan Note (Signed)
 Checking labs today.  Continue current therapy for lipid control. Will modify as needed based on labwork results.   -CMP w/eGFR -Lipid Panel

## 2024-12-03 ENCOUNTER — Encounter: Payer: Self-pay | Admitting: Family

## 2024-12-03 ENCOUNTER — Ambulatory Visit: Admitting: Family

## 2024-12-03 VITALS — BP 118/82 | HR 89 | Ht 70.5 in | Wt 244.0 lb

## 2024-12-03 DIAGNOSIS — R600 Localized edema: Secondary | ICD-10-CM

## 2024-12-03 DIAGNOSIS — M5432 Sciatica, left side: Secondary | ICD-10-CM

## 2024-12-03 DIAGNOSIS — I83813 Varicose veins of bilateral lower extremities with pain: Secondary | ICD-10-CM

## 2024-12-03 MED ORDER — ONDANSETRON 4 MG PO TBDP: 4.0000 mg | ORAL_TABLET | Freq: Three times a day (TID) | ORAL | 0 refills | Status: AC | PRN

## 2024-12-03 NOTE — Progress Notes (Unsigned)
 "  Established Patient Office Visit  Subjective:  Patient ID: Faith Hill, female    DOB: 1981/06/07  Age: 44 y.o. MRN: [id number]  Chief Complaint  Patient presents with   Follow-up    3 week follow up    Patient here for her follow up.   She has been having issues with her legs swelling, which she meant to mention last time, but forgot due to everything we were talking about.  She says that she has had this problem before and has previously seen [facility], asks if we can send a referral to go back to them.   She is still having pain in her back, says that we are     No other concerns at this time.   Past Medical History:  Diagnosis Date   H/O opioid abuse (HCC)    sober since 2009   Migraine without aura    SCC (squamous cell carcinoma) 11/19/2023   left anterior shoulder, pt scheduled for EDC   Squamous cell carcinoma of skin 05/28/2022   in situ, left hand dorsum, topical 07/03/22    Past Surgical History:  Procedure Laterality Date   WRIST SURGERY Bilateral    pins     Social History   Socioeconomic History   Marital status: Single    Spouse name: Not on file   Number of children: 1   Years of education: Not on file   Highest education level: Some college, no degree  Occupational History   Not on file  Tobacco Use   Smoking status: Former    Types: Cigarettes, E-cigarettes   Smokeless tobacco: Never   Tobacco comments:    Doesn't smoke cigarettes but vapes daily  Vaping Use   Vaping status: Every Day   Start date: 11/04/2010  Substance and Sexual Activity   Alcohol use: Not Currently    Comment: social   Drug use: Not Currently    Comment: quit 2009; h/o opioid abuse   Sexual activity: Yes    Partners: Male    Comment: sometimes  Other Topics Concern   Not on file  Social History Narrative   Caffeine: about 40 oz daily   Lives at home with her daughter and mother lives with them   Right handed   Social Drivers of  Health   Tobacco Use: Medium Risk (12/03/2024)   Patient History    Smoking Tobacco Use: Former    Smokeless Tobacco Use: Never    Passive Exposure: Not on Actuary Strain: Low Risk  (01/28/2024)   Received from [facility]   Overall Financial Resource Strain (CARDIA)    Difficulty of Paying Living Expenses: Not hard at all  Food Insecurity: No Food Insecurity (01/28/2024)   Received from [facility]   Epic    Within the past 12 months, you worried that your food would run out before you got the money to buy more.: Never true    Within the past 12 months, the food you bought just didn't last and you didn't have money to get more.: Never true  Transportation Needs: No Transportation Needs (01/28/2024)   Received from [facility] - Transportation    In the past 12 months, has lack of transportation kept you from medical appointments or from getting medications?: No    Lack of Transportation (Non-Medical): No  Physical Activity: Not on file  Stress: Not on file  Social Connections: Not  on file  Intimate Partner Violence: Not At Risk (07/06/2024)   Epic    Fear of Current or Ex-Partner: No    Emotionally Abused: No    Physically Abused: No    Sexually Abused: No  Depression (PHQ2-9): Medium Risk (09/02/2024)   Depression (PHQ2-9)    PHQ-2 Score: 5  Alcohol Screen: Not on file  Housing: Low Risk  (01/28/2024)   Received from [facility]   Epic    In the last 12 months, was there a time when you were not able to pay the mortgage or rent on time?: No    In the past 12 months, how many times have you moved where you were living?: 0    At any time in the past 12 months, were you homeless or living in a shelter (including now)?: No  Utilities: Not At Risk (01/28/2024)   Received from [facility] Utilities    Threatened with loss of utilities: No  Health  Literacy: Not on file    Family History  Problem Relation Age of Onset   Heart disease Mother    Breast cancer Sister 90   Heart disease Other     Allergies[1]  Review of Systems  All other systems reviewed and are negative.      Objective:   BP 118/82   Pulse 89   Ht 5' 10.5 (1.791 m)   Wt 244 lb (110.7 kg)   SpO2 95%   BMI 34.52 kg/m   Vitals:   12/03/24 0933  BP: 118/82  Pulse: 89  Height: 5' 10.5 (1.791 m)  Weight: 244 lb (110.7 kg)  SpO2: 95%  BMI (Calculated): 34.5    Physical Exam Vitals and nursing note reviewed.  Constitutional:      Appearance: Normal appearance. She is normal weight.  HENT:     Head: Normocephalic.  Eyes:     Extraocular Movements: Extraocular movements intact.     Conjunctiva/sclera: Conjunctivae normal.     Pupils: Pupils are equal, round, and reactive to light.  Cardiovascular:     Rate and Rhythm: Normal rate.  Pulmonary:     Effort: Pulmonary effort is normal.  Neurological:     General: No focal deficit present.     Mental Status: She is alert and oriented to person, place, and time. Mental status is at baseline.  Psychiatric:        Mood and Affect: Mood normal.        Behavior: Behavior normal.        Thought Content: Thought content normal.      No results found for any visits on 12/03/24.  No results found for this or any previous visit (from the past 2160 hours).     Assessment & Plan Sciatica of left side  Varicose veins of both lower extremities with pain  Bilateral lower extremity edema     No follow-ups on file.   Total time spent: {AMA time spent:29001} minutes  ALAN CHRISTELLA ARRANT, FNP  12/03/2024   This document may have been prepared by [facility] Voice Recognition software and as such may include unintentional dictation errors.       [1] Allergies Allergen Reactions   Sulfa Antibiotics Hives and Rash    SULFA DRUGS  "

## 2024-12-04 LAB — CMP14+EGFR
ALT: 57 [IU]/L — ABNORMAL HIGH (ref 0–32)
AST: 30 [IU]/L (ref 0–40)
Albumin: 4.1 g/dL (ref 3.9–4.9)
Alkaline Phosphatase: 129 [IU]/L — ABNORMAL HIGH (ref 41–116)
BUN/Creatinine Ratio: 23 (ref 9–23)
BUN: 18 mg/dL (ref 6–24)
Bilirubin Total: 0.3 mg/dL (ref 0.0–1.2)
CO2: 26 mmol/L (ref 20–29)
Calcium: 8.9 mg/dL (ref 8.7–10.2)
Chloride: 94 mmol/L — ABNORMAL LOW (ref 96–106)
Creatinine, Ser: 0.77 mg/dL (ref 0.57–1.00)
Globulin, Total: 2.5 g/dL (ref 1.5–4.5)
Glucose: 114 mg/dL — ABNORMAL HIGH (ref 70–99)
Potassium: 3.6 mmol/L (ref 3.5–5.2)
Sodium: 136 mmol/L (ref 134–144)
Total Protein: 6.6 g/dL (ref 6.0–8.5)
eGFR: 97 mL/min/{1.73_m2}

## 2024-12-04 LAB — CBC WITH DIFFERENTIAL/PLATELET
Basophils Absolute: 0 10*3/uL (ref 0.0–0.2)
Basos: 1 %
EOS (ABSOLUTE): 0.4 10*3/uL (ref 0.0–0.4)
Eos: 6 %
Hematocrit: 38.4 % (ref 34.0–46.6)
Hemoglobin: 13.2 g/dL (ref 11.1–15.9)
Immature Grans (Abs): 0 10*3/uL (ref 0.0–0.1)
Immature Granulocytes: 0 %
Lymphocytes Absolute: 2.1 10*3/uL (ref 0.7–3.1)
Lymphs: 33 %
MCH: 30.5 pg (ref 26.6–33.0)
MCHC: 34.4 g/dL (ref 31.5–35.7)
MCV: 89 fL (ref 79–97)
Monocytes Absolute: 0.5 10*3/uL (ref 0.1–0.9)
Monocytes: 7 %
Neutrophils Absolute: 3.3 10*3/uL (ref 1.4–7.0)
Neutrophils: 53 %
Platelets: 196 10*3/uL (ref 150–450)
RBC: 4.33 x10E6/uL (ref 3.77–5.28)
RDW: 12 % (ref 11.7–15.4)
WBC: 6.4 10*3/uL (ref 3.4–10.8)

## 2024-12-04 LAB — LIPID PANEL
Chol/HDL Ratio: 4.1 ratio (ref 0.0–4.4)
Cholesterol, Total: 169 mg/dL (ref 100–199)
HDL: 41 mg/dL
LDL Chol Calc (NIH): 96 mg/dL (ref 0–99)
Triglycerides: 188 mg/dL — ABNORMAL HIGH (ref 0–149)
VLDL Cholesterol Cal: 32 mg/dL (ref 5–40)

## 2024-12-04 LAB — HEMOGLOBIN A1C
Est. average glucose Bld gHb Est-mCnc: 114 mg/dL
Hgb A1c MFr Bld: 5.6 % (ref 4.8–5.6)

## 2024-12-04 LAB — TSH: TSH: 1.43 u[IU]/mL (ref 0.450–4.500)

## 2024-12-04 LAB — VITAMIN B12: Vitamin B-12: 879 pg/mL (ref 232–1245)

## 2024-12-04 LAB — VITAMIN D 25 HYDROXY (VIT D DEFICIENCY, FRACTURES): Vit D, 25-Hydroxy: 28.4 ng/mL — ABNORMAL LOW (ref 30.0–100.0)

## 2024-12-04 LAB — CORTISOL: Cortisol: 15.2 ug/dL (ref 6.2–19.4)

## 2024-12-09 ENCOUNTER — Ambulatory Visit: Payer: Self-pay

## 2025-03-01 ENCOUNTER — Ambulatory Visit: Admitting: Family
